# Patient Record
Sex: Male | Born: 1935 | Race: Black or African American | Hispanic: No | Marital: Married | Smoking: Never smoker
Health system: Southern US, Community
[De-identification: ages and names within clinical notes are randomized; demographics above are authoritative.]

## PROBLEM LIST (undated history)

## (undated) DIAGNOSIS — N183 Chronic kidney disease, stage 3 unspecified: Secondary | ICD-10-CM

## (undated) DIAGNOSIS — E119 Type 2 diabetes mellitus without complications: Secondary | ICD-10-CM

## (undated) DIAGNOSIS — I5189 Other ill-defined heart diseases: Secondary | ICD-10-CM

## (undated) DIAGNOSIS — I10 Essential (primary) hypertension: Secondary | ICD-10-CM

## (undated) DIAGNOSIS — M1990 Unspecified osteoarthritis, unspecified site: Secondary | ICD-10-CM

## (undated) DIAGNOSIS — R079 Chest pain, unspecified: Secondary | ICD-10-CM

## (undated) DIAGNOSIS — N429 Disorder of prostate, unspecified: Secondary | ICD-10-CM

## (undated) DIAGNOSIS — M109 Gout, unspecified: Secondary | ICD-10-CM

## (undated) HISTORY — PX: ABDOMINAL SURGERY: SHX537

## (undated) HISTORY — DX: Type 2 diabetes mellitus without complications: E11.9

## (undated) HISTORY — DX: Other ill-defined heart diseases: I51.89

## (undated) HISTORY — PX: SMALL INTESTINE SURGERY: SHX150

## (undated) HISTORY — DX: Essential (primary) hypertension: I10

## (undated) HISTORY — DX: Chest pain, unspecified: R07.9

---

## 2004-03-04 ENCOUNTER — Ambulatory Visit: Payer: Self-pay | Admitting: Internal Medicine

## 2005-08-29 ENCOUNTER — Emergency Department: Payer: Self-pay | Admitting: Emergency Medicine

## 2005-08-29 ENCOUNTER — Inpatient Hospital Stay: Payer: Self-pay | Admitting: Cardiology

## 2005-08-29 ENCOUNTER — Ambulatory Visit: Payer: Self-pay | Admitting: Internal Medicine

## 2005-08-29 ENCOUNTER — Other Ambulatory Visit: Payer: Self-pay

## 2005-08-30 ENCOUNTER — Other Ambulatory Visit: Payer: Self-pay

## 2005-08-31 ENCOUNTER — Other Ambulatory Visit: Payer: Self-pay

## 2005-10-25 ENCOUNTER — Emergency Department: Payer: Self-pay | Admitting: Internal Medicine

## 2012-10-24 ENCOUNTER — Ambulatory Visit: Payer: Self-pay | Admitting: Ophthalmology

## 2013-06-03 ENCOUNTER — Inpatient Hospital Stay: Payer: Self-pay

## 2013-06-03 LAB — CBC WITH DIFFERENTIAL/PLATELET
BASOS ABS: 0 10*3/uL (ref 0.0–0.1)
BASOS PCT: 0.4 %
Basophil #: 0 10*3/uL (ref 0.0–0.1)
Basophil %: 0.4 %
EOS ABS: 0 10*3/uL (ref 0.0–0.7)
Eosinophil #: 0 10*3/uL (ref 0.0–0.7)
Eosinophil %: 0.4 %
Eosinophil %: 0.2 %
HCT: 34.9 % — ABNORMAL LOW (ref 40.0–52.0)
HCT: 27.4 % — AB (ref 40.0–52.0)
HGB: 11.9 g/dL — ABNORMAL LOW (ref 13.0–18.0)
HGB: 9.4 g/dL — ABNORMAL LOW (ref 13.0–18.0)
LYMPHS ABS: 1.2 10*3/uL (ref 1.0–3.6)
LYMPHS ABS: 1.7 10*3/uL (ref 1.0–3.6)
LYMPHS PCT: 19.7 %
LYMPHS PCT: 24.1 %
MCH: 33.6 pg (ref 26.0–34.0)
MCH: 33.9 pg (ref 26.0–34.0)
MCHC: 34.1 g/dL (ref 32.0–36.0)
MCHC: 34.4 g/dL (ref 32.0–36.0)
MCV: 98 fL (ref 80–100)
MCV: 99 fL (ref 80–100)
MONOS PCT: 10.2 %
Monocyte #: 0.5 x10 3/mm (ref 0.2–1.0)
Monocyte #: 0.7 x10 3/mm (ref 0.2–1.0)
Monocyte %: 7.8 %
NEUTROS PCT: 71.7 %
NEUTROS PCT: 65.1 %
Neutrophil #: 4.4 10*3/uL (ref 1.4–6.5)
Neutrophil #: 4.5 10*3/uL (ref 1.4–6.5)
Platelet: 179 10*3/uL (ref 150–440)
Platelet: 173 10*3/uL (ref 150–440)
RBC: 3.54 10*6/uL — ABNORMAL LOW (ref 4.40–5.90)
RBC: 2.78 10*6/uL — ABNORMAL LOW (ref 4.40–5.90)
RDW: 16.1 % — AB (ref 11.5–14.5)
RDW: 15.8 % — ABNORMAL HIGH (ref 11.5–14.5)
WBC: 6.2 10*3/uL (ref 3.8–10.6)
WBC: 6.9 10*3/uL (ref 3.8–10.6)

## 2013-06-03 LAB — COMPREHENSIVE METABOLIC PANEL
ALBUMIN: 3 g/dL — AB (ref 3.4–5.0)
ANION GAP: 7 (ref 7–16)
Alkaline Phosphatase: 92 U/L
BILIRUBIN TOTAL: 0.3 mg/dL (ref 0.2–1.0)
BUN: 21 mg/dL — ABNORMAL HIGH (ref 7–18)
Calcium, Total: 8.5 mg/dL (ref 8.5–10.1)
Chloride: 110 mmol/L — ABNORMAL HIGH (ref 98–107)
Co2: 22 mmol/L (ref 21–32)
Creatinine: 1.5 mg/dL — ABNORMAL HIGH (ref 0.60–1.30)
GFR CALC AF AMER: 50 — AB
GFR CALC NON AF AMER: 43 — AB
Glucose: 158 mg/dL — ABNORMAL HIGH (ref 65–99)
Osmolality: 284 (ref 275–301)
POTASSIUM: 3.8 mmol/L (ref 3.5–5.1)
SGOT(AST): 35 U/L (ref 15–37)
SGPT (ALT): 34 U/L (ref 12–78)
Sodium: 139 mmol/L (ref 136–145)
Total Protein: 7 g/dL (ref 6.4–8.2)

## 2013-06-03 LAB — URINALYSIS, COMPLETE
Bacteria: NONE SEEN
Bilirubin,UR: NEGATIVE
Blood: NEGATIVE
GLUCOSE, UR: NEGATIVE mg/dL (ref 0–75)
Hyaline Cast: 4
KETONE: NEGATIVE
Leukocyte Esterase: NEGATIVE
Nitrite: NEGATIVE
Ph: 6 (ref 4.5–8.0)
Protein: 25
Specific Gravity: 1.035 (ref 1.003–1.030)
Squamous Epithelial: 1
WBC UR: 1 /HPF (ref 0–5)

## 2013-06-03 LAB — BASIC METABOLIC PANEL
Anion Gap: 6 — ABNORMAL LOW (ref 7–16)
BUN: 18 mg/dL (ref 7–18)
Calcium, Total: 7.9 mg/dL — ABNORMAL LOW (ref 8.5–10.1)
Chloride: 113 mmol/L — ABNORMAL HIGH (ref 98–107)
Co2: 24 mmol/L (ref 21–32)
Creatinine: 1.39 mg/dL — ABNORMAL HIGH (ref 0.60–1.30)
EGFR (African American): 55 — ABNORMAL LOW
EGFR (Non-African Amer.): 48 — ABNORMAL LOW
GLUCOSE: 123 mg/dL — AB (ref 65–99)
OSMOLALITY: 288 (ref 275–301)
POTASSIUM: 3.9 mmol/L (ref 3.5–5.1)
SODIUM: 143 mmol/L (ref 136–145)

## 2013-06-03 LAB — HEMOGLOBIN: HGB: 8.6 g/dL — ABNORMAL LOW (ref 13.0–18.0)

## 2013-06-03 LAB — PROTIME-INR
INR: 1
INR: 1.1
Prothrombin Time: 13.2 secs (ref 11.5–14.7)
Prothrombin Time: 14 secs (ref 11.5–14.7)

## 2013-06-03 LAB — LIPASE, BLOOD: Lipase: 149 U/L (ref 73–393)

## 2013-06-04 LAB — CBC WITH DIFFERENTIAL/PLATELET
BASOS ABS: 0 10*3/uL (ref 0.0–0.1)
BASOS PCT: 0.3 %
EOS ABS: 0 10*3/uL (ref 0.0–0.7)
EOS PCT: 0 %
HCT: 22.3 % — ABNORMAL LOW (ref 40.0–52.0)
HGB: 7.6 g/dL — ABNORMAL LOW (ref 13.0–18.0)
Lymphocyte #: 1.4 10*3/uL (ref 1.0–3.6)
Lymphocyte %: 17.7 %
MCH: 33.7 pg (ref 26.0–34.0)
MCHC: 34.2 g/dL (ref 32.0–36.0)
MCV: 99 fL (ref 80–100)
Monocyte #: 0.6 x10 3/mm (ref 0.2–1.0)
Monocyte %: 7.9 %
NEUTROS ABS: 5.9 10*3/uL (ref 1.4–6.5)
Neutrophil %: 74.1 %
Platelet: 144 10*3/uL — ABNORMAL LOW (ref 150–440)
RBC: 2.26 10*6/uL — ABNORMAL LOW (ref 4.40–5.90)
RDW: 15.9 % — ABNORMAL HIGH (ref 11.5–14.5)
WBC: 7.9 10*3/uL (ref 3.8–10.6)

## 2013-06-04 LAB — BASIC METABOLIC PANEL
Anion Gap: 5 — ABNORMAL LOW (ref 7–16)
BUN: 19 mg/dL — AB (ref 7–18)
CREATININE: 1.38 mg/dL — AB (ref 0.60–1.30)
Calcium, Total: 7.8 mg/dL — ABNORMAL LOW (ref 8.5–10.1)
Chloride: 111 mmol/L — ABNORMAL HIGH (ref 98–107)
Co2: 24 mmol/L (ref 21–32)
EGFR (African American): 56 — ABNORMAL LOW
EGFR (Non-African Amer.): 48 — ABNORMAL LOW
Glucose: 126 mg/dL — ABNORMAL HIGH (ref 65–99)
Osmolality: 283 (ref 275–301)
Potassium: 4.1 mmol/L (ref 3.5–5.1)
Sodium: 140 mmol/L (ref 136–145)

## 2013-06-04 LAB — HEMOGLOBIN: HGB: 9 g/dL — ABNORMAL LOW (ref 13.0–18.0)

## 2013-06-05 LAB — BASIC METABOLIC PANEL
Anion Gap: 5 — ABNORMAL LOW (ref 7–16)
BUN: 12 mg/dL (ref 7–18)
CREATININE: 1.41 mg/dL — AB (ref 0.60–1.30)
Calcium, Total: 8.1 mg/dL — ABNORMAL LOW (ref 8.5–10.1)
Chloride: 111 mmol/L — ABNORMAL HIGH (ref 98–107)
Co2: 25 mmol/L (ref 21–32)
EGFR (African American): 54 — ABNORMAL LOW
EGFR (Non-African Amer.): 47 — ABNORMAL LOW
GLUCOSE: 89 mg/dL (ref 65–99)
Osmolality: 280 (ref 275–301)
Potassium: 3.6 mmol/L (ref 3.5–5.1)
Sodium: 141 mmol/L (ref 136–145)

## 2013-06-05 LAB — CBC WITH DIFFERENTIAL/PLATELET
BASOS PCT: 0.6 %
Basophil #: 0.1 10*3/uL (ref 0.0–0.1)
EOS ABS: 0.1 10*3/uL (ref 0.0–0.7)
EOS PCT: 1.5 %
HCT: 25.1 % — ABNORMAL LOW (ref 40.0–52.0)
HGB: 8.7 g/dL — ABNORMAL LOW (ref 13.0–18.0)
LYMPHS ABS: 2.1 10*3/uL (ref 1.0–3.6)
LYMPHS PCT: 22.1 %
MCH: 33.1 pg (ref 26.0–34.0)
MCHC: 34.8 g/dL (ref 32.0–36.0)
MCV: 95 fL (ref 80–100)
MONO ABS: 1.1 x10 3/mm — AB (ref 0.2–1.0)
MONOS PCT: 11.5 %
Neutrophil #: 6 10*3/uL (ref 1.4–6.5)
Neutrophil %: 64.3 %
Platelet: 148 10*3/uL — ABNORMAL LOW (ref 150–440)
RBC: 2.63 10*6/uL — AB (ref 4.40–5.90)
RDW: 16.8 % — AB (ref 11.5–14.5)
WBC: 9.4 10*3/uL (ref 3.8–10.6)

## 2013-06-05 LAB — HEMOGLOBIN: HGB: 9.2 g/dL — AB (ref 13.0–18.0)

## 2013-06-06 LAB — CBC WITH DIFFERENTIAL/PLATELET
Basophil #: 0 10*3/uL (ref 0.0–0.1)
Basophil %: 0.6 %
EOS PCT: 2 %
Eosinophil #: 0.2 10*3/uL (ref 0.0–0.7)
HCT: 25.3 % — ABNORMAL LOW (ref 40.0–52.0)
HGB: 8.8 g/dL — ABNORMAL LOW (ref 13.0–18.0)
Lymphocyte #: 1.8 10*3/uL (ref 1.0–3.6)
Lymphocyte %: 21.1 %
MCH: 33.2 pg (ref 26.0–34.0)
MCHC: 34.7 g/dL (ref 32.0–36.0)
MCV: 96 fL (ref 80–100)
Monocyte #: 1.1 x10 3/mm — ABNORMAL HIGH (ref 0.2–1.0)
Monocyte %: 13.5 %
NEUTROS PCT: 62.8 %
Neutrophil #: 5.3 10*3/uL (ref 1.4–6.5)
PLATELETS: 154 10*3/uL (ref 150–440)
RBC: 2.64 10*6/uL — ABNORMAL LOW (ref 4.40–5.90)
RDW: 16.8 % — ABNORMAL HIGH (ref 11.5–14.5)
WBC: 8.5 10*3/uL (ref 3.8–10.6)

## 2013-07-15 ENCOUNTER — Ambulatory Visit: Payer: Self-pay | Admitting: Gastroenterology

## 2013-07-17 LAB — PATHOLOGY REPORT

## 2014-03-11 DIAGNOSIS — D649 Anemia, unspecified: Secondary | ICD-10-CM | POA: Insufficient documentation

## 2014-03-11 DIAGNOSIS — M1A9XX0 Chronic gout, unspecified, without tophus (tophi): Secondary | ICD-10-CM | POA: Insufficient documentation

## 2014-03-11 DIAGNOSIS — M1A00X0 Idiopathic chronic gout, unspecified site, without tophus (tophi): Secondary | ICD-10-CM | POA: Insufficient documentation

## 2014-09-10 DIAGNOSIS — M170 Bilateral primary osteoarthritis of knee: Secondary | ICD-10-CM | POA: Insufficient documentation

## 2014-09-20 NOTE — Consult Note (Signed)
Chief Complaint:  Subjective/Chief Complaint Patient seen, briefly examined, chart reviewed.  Please see full GI consult and brief consult note.  Patient presenting with 2-3 days of intermittant hematochezia.  Denies abd pain n or v.  Plans for urgent bleeding scan.  Hemodynamically stable.  Further recs to follow.   VITAL SIGNS/ANCILLARY NOTES: **Vital Signs.:   05-Jan-15 15:30  Vital Signs Type Admission  Temperature Temperature (F) 97.9  Celsius 36.6  Temperature Source oral  Pulse Pulse 60  Respirations Respirations 20  Systolic BP Systolic BP 169  Diastolic BP (mmHg) Diastolic BP (mmHg) 75  Mean BP 105  Pulse Ox % Pulse Ox % 100  Pulse Ox Activity Level  At rest  Oxygen Delivery Room Air/ 21 %   Brief Assessment:  Cardiac Regular   Respiratory clear BS   Gastrointestinal details normal No gaurding  No rigidity  protuberant, non-tender, bs positive   Lab Results: Hepatic:  05-Jan-15 10:22   Bilirubin, Total 0.3  Alkaline Phosphatase 92 (45-117 NOTE: New Reference Range 04/19/13)  SGPT (ALT) 34  SGOT (AST) 35  Total Protein, Serum 7.0  Albumin, Serum  3.0  Routine BB:  05-Jan-15 11:35   ABO Group + Rh Type B Positive  Antibody Screen NEGATIVE (Result(s) reported on 03 Jun 2013 at 12:44PM.)  Routine Chem:  05-Jan-15 10:22   Lipase 149 (Result(s) reported on 03 Jun 2013 at 12:14PM.)  Glucose, Serum  158  BUN  21  Creatinine (comp)  1.50  Sodium, Serum 139  Potassium, Serum 3.8  Chloride, Serum  110  CO2, Serum 22  Calcium (Total), Serum 8.5  Osmolality (calc) 284  eGFR (African American)  50  eGFR (Non-African American)  43 (eGFR values <61m/min/1.73 m2 may be an indication of chronic kidney disease (CKD). Calculated eGFR is useful in patients with stable renal function. The eGFR calculation will not be reliable in acutely ill patients when serum creatinine is changing rapidly. It is not useful in  patients on dialysis. The eGFR calculation may not be  applicable to patients at the low and high extremes of body sizes, pregnant women, and vegetarians.)  Anion Gap 7  Routine UA:  05-Jan-15 10:22   Color (UA) Yellow  Clarity (UA) Clear  Glucose (UA) Negative  Bilirubin (UA) Negative  Ketones (UA) Negative  Specific Gravity (UA) 1.035  Blood (UA) Negative  pH (UA) 6.0  Protein (UA) 25 mg/dL  Nitrite (UA) Negative  Leukocyte Esterase (UA) Negative (Result(s) reported on 03 Jun 2013 at 10:56AM.)  RBC (UA) <1 /HPF  WBC (UA) 1 /HPF  Bacteria (UA) NONE SEEN  Epithelial Cells (UA) 1 /HPF  Mucous (UA) PRESENT  Hyaline Cast (UA) 4 /LPF (Result(s) reported on 03 Jun 2013 at 10:56AM.)  Routine Coag:  05-Jan-15 10:22   Prothrombin 13.2  INR 1.0 (INR reference interval applies to patients on anticoagulant therapy. A single INR therapeutic range for coumarins is not optimal for all indications; however, the suggested range for most indications is 2.0 - 3.0. Exceptions to the INR Reference Range may include: Prosthetic heart valves, acute myocardial infarction, prevention of myocardial infarction, and combinations of aspirin and anticoagulant. The need for a higher or lower target INR must be assessed individually. Reference: The Pharmacology and Management of the Vitamin K  antagonists: the seventh ACCP Conference on Antithrombotic and Thrombolytic Therapy. CIHWTU.8828Sept:126 (3suppl): [id number] A HCT value >55% may artifactually increase the PT.  In one study,  the increase was an average of 25%. Reference:  "  Effect on Routine and Special Coagulation Testing Values of Citrate Anticoagulant Adjustment in Patients with High HCT Values." American Journal of Clinical Pathology 2006;126:400-405.)  Routine Hem:  05-Jan-15 10:22   WBC (CBC) 6.2  RBC (CBC)  3.54  Hemoglobin (CBC)  11.9  Hematocrit (CBC)  34.9  Platelet Count (CBC) 179  MCV 98  MCH 33.6  MCHC 34.1  RDW  16.1  Neutrophil % 71.7  Lymphocyte % 19.7  Monocyte % 7.8   Eosinophil % 0.4  Basophil % 0.4  Neutrophil # 4.4  Lymphocyte # 1.2  Monocyte # 0.5  Eosinophil # 0.0  Basophil # 0.0 (Result(s) reported on 03 Jun 2013 at 10:47AM.)   Electronic Signatures: Loistine Simas (MD)  (Signed 05-Jan-15 17:06)  Authored: Chief Complaint, VITAL SIGNS/ANCILLARY NOTES, Brief Assessment, Lab Results   Last Updated: 05-Jan-15 17:06 by Loistine Simas (MD)

## 2014-09-20 NOTE — Consult Note (Signed)
Brief Consult Note: Diagnosis: rectal bleeding.   Patient was seen by consultant.   Consult note dictated.   Comments: Appreciate consult for 81 y/o [place] American man for evaluation of rectal bleeding. No history of prior luminal evaluation. States he was doing well until Saturday night when he started having rectal bleeding. Describes passing 4 liquid marroon stools with some marroon formed pieces that evening, followed by 2 similar movements Sunday, and 2 this am- last was about 0900 this am. States that just prior to the mvt, he feels a little bloaty and has an urgent stool. Denies abdominal discomfort and abdominal pain, NV, problems with constipation, dyspepsia, and all other GI related complaints. His only other complaints are dizziness after bowel movements that started today, weakness, and some mild DOE. Of note, he was seen last October for heme positive stool in the GI clinic, but decided not to pursue luminal evaluation at that time. Hgb last Aug was 13.5. Now 11.5, pt/inr normal. Hemodynamically stable. Additionally- during exam patient passed a large amount of thin maroon blood in the commode. Abdomen protbuerant but benign. Rectal exam with red effluent of finger. No external hemorrhoids, however inside anal ring was a soft oblong area consistent with a hemorrhoid. Nontender. Impression and plan. Rectal bleeding. Differential includes diverticular bleeding v. other. Will start with GIB scan now, followed by vascular consult if positive. Also recommend following serial hemoglobins, transfusing prn.Npo for now.  Did speak with nuclear med as it is after hrs and they are arranging for scan..  Electronic Signatures: Stephens November H (NP)  (Signed 05-Jan-15 16:58)  Authored: Brief Consult Note   Last Updated: 05-Jan-15 16:58 by Theodore Demark (NP)

## 2014-09-20 NOTE — Op Note (Signed)
PATIENT NAME:  Wesley Burton, Wesley Burton MR#:  [id number] DATE OF BIRTH:  [birth year]  DATE OF PROCEDURE:  06/04/2013  PREOPERATIVE DIAGNOSES: 1. Lower gastrointestinal bleed.  2. Chronic kidney disease.  3. Hypertension.   POSTOPERATIVE DIAGNOSES:  1. Lower gastrointestinal bleed.  2. Chronic kidney disease.  3. Hypertension.   PROCEDURE: 1. Ultrasound guidance for vascular access, right femoral artery.  2. Catheter placement to IMA branches from right femoral approach.  3. Microbead polyvinyl alcohol embolization to inferior mesenteric artery and branches.  4. StarClose closure device, right femoral artery.   SURGEON: Algernon Huxley, M.D.   ANESTHESIA: Local with moderate conscious sedation.   ESTIMATED BLOOD LOSS: Minimal.   FLUOROSCOPY TIME: Approximately  4 minutes.   CONTRAST USED: 28 mL.   INDICATION FOR PROCEDURE: An 81 year old African American male who was admitted yesterday with lower gastrointestinal bleeding. He had a positive bleeding scan localized to the left colon and sigmoid colon. He is brought down for angiography for further evaluation and potential treatment. Risks and benefits were discussed. Informed consent was obtained.   DESCRIPTION OF PROCEDURE: The patient is brought to the vascular interventional radiology suite. The groin is sterilely prepped and draped and a sterile surgical field was created. The right femoral artery was visualized with ultrasound and found to be widely patent. It was then accessed under direct ultrasound guidance without difficulty with Seldinger needle. A J-wire and 5 French sheath was placed. Pigtail catheter was placed into the aorta and an RAO projection aortogram was performed. This demonstrated a patent IMA. I then used a VS1 catheter, the stiff angled Glidewire to cannulate the IMA. I then exchanged for a 5 French catheter and I was able to get past the primary IMA branches. No obvious blush was seen, but given his positive bleeding scan  findings I elected to treat the area with 500/700 micron polyvinyl alcohol beads. A total of 1 mL was deployed in the IMA sigmoidal left colon branches with flow still seen to the major branches after microbead embolization. At this point, I elected to terminate the procedure. The diagnostic catheter was removed. An oblique arteriogram was performed with the right femoral artery and StarClose closure device was deployed in the usual fashion with excellent hemostatic result. The patient tolerated the procedure well and was taken to the recovery room in stable condition.      ____________________________ Algernon Huxley, MD jsd:sg D: 06/04/2013 08:07:38 ET T: 06/04/2013 08:16:41 ET JOB#: [id number]  cc: Algernon Huxley, MD, <Dictator> Algernon Huxley MD ELECTRONICALLY SIGNED 06/17/2013 11:43

## 2014-09-20 NOTE — H&P (Signed)
PATIENT NAME:  Wesley Burton, Wesley Burton MR#:  [id number] DATE OF BIRTH:  1931-04-08  DATE OF ADMISSION:  06/03/2013  PRIMARY CARE PHYSICIAN: Cheral Marker. Ola Spurr, MD  CHIEF COMPLAINT: Rectal bleeding with diarrhea.   HISTORY OF PRESENTING ILLNESS: This 81 year old male patient with history of hypertension, CKD, arthritis, and possible hemorrhoids presents to the Emergency Room complaining of 3 days of rectal bleeding with diarrhea. The patient's episodes started with 3 rounds of diarrhea, which were liquid on Saturday along with dark red and bright red blood. The patient did feel a bit lightheaded, dizzy. He thought this would resolve, but this continued until today and the patient has presented to the Emergency Room. Here, the patient's hemoglobin is 11.5, which is same as prior blood work available from 2007. The patient is being admitted to the hospitalist service for further work-up of his rectal bleeding. The patient mentions that he had some blood in the stool a few months back on cleaning and was thought to have likely hemorrhoids, although he never had a colonoscopy or any rectal exam per patient. He was given stool cards, although he is not aware of those results. He was referred to a doctor by his primary care physician, although records are not available and he does not remember the doctor's name, possibly GI consultant.   Today, he does not have any nausea, vomiting, abdominal pain. He is not on any aspirin, Plavix, Coumadin, or other anticoagulants. Never had similar amount of blood, which he mentions is in large quantities now. In the Emergency Room, rectal exam was done by Dr. Corky Downs of Emergency Room, who did not find any hemorrhoids but stool positive for blood.   PAST MEDICAL HISTORY: 1.  Hypertension.  2.  Chronic kidney disease, stage III.  3.  Arthritis.   HOME MEDICATIONS: Include: 1.  Lisinopril 40 mg daily.  2.  Hydrochlorothiazide/triamterene 1 tablet daily.  3.  Atenolol 25 mg  daily.  4.  Cardura 4 mg daily.   SOCIAL HISTORY: The patient is married, lives with wife. Has never smoked. No alcohol. No illicit drugs. He is retired.   CODE STATUS: Full code.  FAMILY HISTORY: Father died at age 61, had hypertension.   REVIEW OF SYSTEMS:    CONSTITUTIONAL: No fever. Has some fatigue.  EYES: No blurred vision, pain, redness.  EARS, NOSE, THROAT: No tinnitus, ear pain, hearing loss.  RESPIRATORY: No cough, wheeze, hemoptysis.  CARDIOVASCULAR: No chest pain, orthopnea, edema.  GASTROINTESTINAL: No nausea, vomiting. Has diarrhea and rectal bleeding.  GENITOURINARY: No dysuria, hematuria, frequency.  ENDOCRINE: No polyuria, nocturia, thyroid problems. HEMATOLOGIC AND LYMPHATIC: No anemia, easy bruising. Does have bleeding.  INTEGUMENTARY: No acne, rash, lesion.  MUSCULOSKELETAL: No back pain, arthritis.  NEUROLOGIC: No focal numbness, weakness, dysarthria. PSYCHIATRIC: No anxiety, depression.   ALLERGIES: No known drug allergies.   PHYSICAL EXAMINATION: VITAL SIGNS: Temperature 98.2, pulse 71, blood pressure 159/70, saturating 99% on room air.  GENERAL: Obese African American male patient lying in bed, seems comfortable, conversational, cooperative with exam.  PSYCHIATRIC: Alert, oriented x 3. Mood and affect appropriate. Judgment intact.  HEENT: Atraumatic, normocephalic. Oral mucosa moist and pink. External ears and nose normal. No pallor or icterus. Pupils bilaterally equal and reactive to light.  NECK: Supple. No thyromegaly. No palpable lymph nodes. Trachea midline. No carotid bruits or JVD.  CARDIOVASCULAR: S1, S2, without any murmurs. Peripheral pulses 2+.  RESPIRATORY: Normal work of breathing. Clear to auscultation on both sides.  GASTROINTESTINAL: Soft abdomen, nontender. Bowel  sounds present. No hepatosplenomegaly palpable.  RECTAL: Deferred.  GENITOURINARY: No CVA tenderness or bladder distention.  SKIN: Warm and dry. No petechiae, rash, ulcers.   MUSCULOSKELETAL: No joint swelling, redness, effusion of the large joints. Normal muscle tone.  NEUROLOGICAL: Motor strength 5/5 in upper and lower extremities. Sensation remains intact all over.  LYMPHATIC: No cervical lymphadenopathy.   LABORATORY AND DIAGNOSTIC DATA: Show glucose of 158, BUN 21, creatinine 1.50, sodium 139, potassium 3.8, GFR of 43. AST, ALT, alkaline phosphatase, bilirubin normal. WBC 6.3, hemoglobin 11.9, platelets 179.   Blood group is B negative.   INR 1.   Urinalysis shows no bacteria.   EKG shows normal sinus rhythm with T wave inversions in the lateral leads.   ASSESSMENT AND PLAN: 1.  Rectal bleed with diarrhea in a patient who is not on any blood thinners. He is afebrile. White count is normal. The patient did have 3 episodes of liquid stools initially, but this seems to have resolved at this time. I suspect the patient likely has diverticular bleed. He mentions he never had a colonoscopy, although he seems to be a poor historian. Will monitor the hemoglobin at this time. Will need a colonoscopy. Need to make a decision as to if he needs this inpatient or outpatient. Will admit for observation. Consult GI. No heparin or Lovenox. Will check a hemoglobin in 6 hours and repeat tomorrow morning. May need blood transfusion if there is acute drop. Will also start him on IV fluids. The patient will be on clear liquid diet.  2.  Hypertension, uncontrolled. Will use IV p.r.n. medications and continue home medications at this time.  3.  Anemia, mild. Seems stable and is normocytic.  4.  Chronic kidney disease III, stable.  5.  Deep venous thrombosis prophylaxis with sequential compression devices.  6.  Code status: Full code.   TIME SPENT: Today on this case was 40 minutes.    ____________________________ Leia Alf Sher Hellinger, MD srs:jcm D: 06/03/2013 14:43:32 ET T: 06/03/2013 15:50:05 ET JOB#: [id number]  cc: Alveta Heimlich R. Orvie Caradine, MD, <Dictator> Cheral Marker. Ola Spurr,  MD Carver MD ELECTRONICALLY SIGNED 06/06/2013 11:14

## 2014-09-20 NOTE — Consult Note (Signed)
CHIEF COMPLAINT and HISTORY:  Subjective/Chief Complaint rectal bleeding   History of Present Illness Patient admitted with painless rectal bleeding and diarrhea for <24 hours.  Has had multiple episodes of bloody stools and diarrhea with blood.  Was found to have left colon/sigmoid area positive for bleeding on nuc med scan, which I have reviewed.   PAST MEDICAL/SURGICAL HISTORY:  Past Medical History:   arthritis:    HTN:   ALLERGIES:  Allergies:  No Known Allergies:   HOME MEDICATIONS:  Home Medications: Medication Instructions Status  atenolol 25 mg oral tablet 1 tab(s) orally once a day Active  doxazosin 4 mg oral tablet 1 tab(s) orally once a day Active  Imdur 30 mg oral tablet, extended release 1 tab(s) orally once a day Active  lisinopril 40 mg oral tablet 1 tab(s) orally once a day Active  hydrochlorothiazide-triamterene 25 mg-37.5 mg oral tablet 1 tab(s) orally once a day Active   Family and Social History:  Family History Non-Contributory   Social History negative tobacco, negative ETOH, negative Illicit drugs   Place of Living Home   Review of Systems:  Subjective/Chief Complaint BRBPR   Fever/Chills No   Cough No   Sputum No   Abdominal Pain No   Diarrhea Yes   Constipation No   Nausea/Vomiting No   SOB/DOE No   Chest Pain No   Telemetry Reviewed NSR   Dysuria No   Tolerating PT Yes   Tolerating Diet Yes   Medications/Allergies Reviewed Medications/Allergies reviewed   Physical Exam:  GEN well developed, well nourished, younger than stated age   HEENT pink conjunctivae, moist oral mucosa   NECK No masses  trachea midline   RESP normal resp effort  no use of accessory muscles   CARD regular rate  no JVD   VASCULAR ACCESS none   ABD denies tenderness  normal BS   GU no superpubic tenderness   LYMPH negative neck, negative axillae   EXTR negative cyanosis/clubbing, negative edema   SKIN normal to palpation, skin turgor  good   NEURO cranial nerves intact, motor/sensory function intact   PSYCH alert, A+O to time, place, person   LABS:  Laboratory Results: Hepatic:    05-Jan-15 10:22, Comprehensive Metabolic Panel  Bilirubin, Total 0.3  Alkaline Phosphatase 92  45-117  NOTE: New Reference Range  04/19/13  SGPT (ALT) 34  SGOT (AST) 35  Total Protein, Serum 7.0  Albumin, Serum 3.0  Routine BB:    05-Jan-15 11:30, Crossmatch 2 Units  Crossmatch Unit 1 Ready  Crossmatch Unit 2 Ready  Result(s) reported on 04 Jun 2013 at 02:05AM.    05-Jan-15 11:35, Type and Antibody Screen  ABO Group + Rh Type   B Positive  Antibody Screen NEGATIVE  Result(s) reported on 03 Jun 2013 at 12:44PM.  Routine Chem:    05-Jan-15 10:22, Comprehensive Metabolic Panel  Glucose, Serum 158  BUN 21  Creatinine (comp) 1.50  Sodium, Serum 139  Potassium, Serum 3.8  Chloride, Serum 110  CO2, Serum 22  Calcium (Total), Serum 8.5  Osmolality (calc) 284  eGFR (African American) 50  eGFR (Non-African American) 43  eGFR values <97m/min/1.73 m2 may be an indication of chronic  kidney disease (CKD).  Calculated eGFR is useful in patients with stable renal function.  The eGFR calculation will not be reliable in acutely ill patients  when serum creatinine is changing rapidly. It is not useful in   patients on dialysis. The eGFR calculation may not be applicable  to patients at the low and high extremes of body sizes, pregnant  women, and vegetarians.  Anion Gap 7    05-Jan-15 10:22, Lipase  Lipase 149  Result(s) reported on 03 Jun 2013 at 12:14PM.    05-Jan-15 73:42, Basic Metabolic Panel (w/Total Calcium)  Glucose, Serum 123  BUN 18  Creatinine (comp) 1.39  Sodium, Serum 143  Potassium, Serum 3.9  Chloride, Serum 113  CO2, Serum 24  Calcium (Total), Serum 7.9  Anion Gap 6  Osmolality (calc) 288  eGFR (African American) 55  eGFR (Non-African American) 48  eGFR values <88m/min/1.73 m2 may be an indication of  chronic  kidney disease (CKD).  Calculated eGFR is useful in patients with stable renal function.  The eGFR calculation will not be reliable in acutely ill patients  when serum creatinine is changing rapidly. It is not useful in   patients on dialysis. The eGFR calculation may not be applicable  to patients at the low and high extremes of body sizes, pregnant  women, and vegetarians.    06-Jan-15 087:68 Basic Metabolic Panel (w/Total Calcium)  Glucose, Serum 126  BUN 19  Creatinine (comp) 1.38  Sodium, Serum 140  Potassium, Serum 4.1  Chloride, Serum 111  CO2, Serum 24  Calcium (Total), Serum 7.8  Anion Gap 5  Osmolality (calc) 283  eGFR (African American) 56  eGFR (Non-African American) 48  eGFR values <640mmin/1.73 m2 may be an indication of chronic  kidney disease (CKD).  Calculated eGFR is useful in patients with stable renal function.  The eGFR calculation will not be reliable in acutely ill patients  when serum creatinine is changing rapidly. It is not useful in   patients on dialysis. The eGFR calculation may not be applicable  to patients at the low and high extremes of body sizes, pregnant  women, and vegetarians.  Routine UA:    05-Jan-15 10:22, Urinalysis  Color (UA) Yellow  Clarity (UA) Clear  Glucose (UA) Negative  Bilirubin (UA) Negative  Ketones (UA) Negative  Specific Gravity (UA) 1.035  Blood (UA) Negative  pH (UA) 6.0  Protein (UA) 25 mg/dL  Nitrite (UA) Negative  Leukocyte Esterase (UA) Negative  Result(s) reported on 03 Jun 2013 at 10:56AM.  RBC (UA) <1 /HPF  WBC (UA) 1 /HPF  Bacteria (UA)   NONE SEEN  Epithelial Cells (UA) 1 /HPF  Mucous (UA) PRESENT  Hyaline Cast (UA) 4 /LPF  Result(s) reported on 03 Jun 2013 at 10:56AM.  Routine Coag:    05-Jan-15 10:22, Prothrombin Time  Prothrombin 13.2  INR 1.0  INR reference interval applies to patients on anticoagulant therapy.  A single INR therapeutic range for coumarins is not optimal for  all  indications; however, the suggested range for most indications is  2.0 - 3.0.  Exceptions to the INR Reference Range may include: Prosthetic heart  valves, acute myocardial infarction, prevention of myocardial  infarction, and combinations of aspirin and anticoagulant. The need  for a higher or lower target INR must be assessed individually.  Reference: The Pharmacology and Management of the Vitamin K   antagonists: the seventh ACCP Conference on Antithrombotic and  Thrombolytic Therapy. ChTLXBW.6203ept:126 (3suppl): 20N9146842 A HCT value >55% may artifactually increase the PT.  In one study,   the increase was an average of 25%.  Reference:  "Effect on Routine and Special Coagulation Testing Values  of Citrate Anticoagulant Adjustment in Patients with High HCT Values."  American Journal of Clinical Pathology 2006;126:400-405.  05-Jan-15 18:58, Prothrombin Time  Prothrombin 14.0  INR 1.1  INR reference interval applies to patients on anticoagulant therapy.  A single INR therapeutic range for coumarins is not optimal for all  indications; however, the suggested range for most indications is  2.0 - 3.0.  Exceptions to the INR Reference Range may include: Prosthetic heart  valves, acute myocardial infarction, prevention of myocardial  infarction, and combinations of aspirin and anticoagulant. The need  for a higher or lower target INR must be assessed individually.  Reference: The Pharmacology and Management of the Vitamin K   antagonists: the seventh ACCP Conference on Antithrombotic and  Thrombolytic Therapy. UQJFH.5456 Sept:126 (3suppl): [id number].  A HCT value >55% may artifactually increase the PT.  In one study,   the increase was an average of 25%.  Reference:  "Effect on Routine and Special Coagulation Testing Values  of Citrate Anticoagulant Adjustment in Patients with High HCT Values."  American Journal of Clinical Pathology 2006;126:400-405.  Routine Hem:     05-Jan-15 10:22, CBC Profile  WBC (CBC) 6.2  RBC (CBC) 3.54  Hemoglobin (CBC) 11.9  Hematocrit (CBC) 34.9  Platelet Count (CBC) 179  MCV 98  MCH 33.6  MCHC 34.1  RDW 16.1  Neutrophil % 71.7  Lymphocyte % 19.7  Monocyte % 7.8  Eosinophil % 0.4  Basophil % 0.4  Neutrophil # 4.4  Lymphocyte # 1.2  Monocyte # 0.5  Eosinophil # 0.0  Basophil # 0.0  Result(s) reported on 03 Jun 2013 at 10:47AM.    05-Jan-15 18:58, CBC Profile  WBC (CBC) 6.9  RBC (CBC) 2.78  Hemoglobin (CBC) 9.4  Hematocrit (CBC) 27.4  Platelet Count (CBC) 173  MCV 99  MCH 33.9  MCHC 34.4  RDW 15.8  Neutrophil % 65.1  Lymphocyte % 24.1  Monocyte % 10.2  Eosinophil % 0.2  Basophil % 0.4  Neutrophil # 4.5  Lymphocyte # 1.7  Monocyte # 0.7  Eosinophil # 0.0  Basophil # 0.0  Result(s) reported on 03 Jun 2013 at 07:42PM.    05-Jan-15 23:55, Hemoglobin  Hemoglobin (CBC) 8.6  Result(s) reported on 03 Jun 2013 at 11:48PM.    06-Jan-15 04:00, CBC Profile  WBC (CBC) 7.9  RBC (CBC) 2.26  Hemoglobin (CBC) 7.6  Hematocrit (CBC) 22.3  Platelet Count (CBC) 144  MCV 99  MCH 33.7  MCHC 34.2  RDW 15.9  Neutrophil % 74.1  Lymphocyte % 17.7  Monocyte % 7.9  Eosinophil % 0.0  Basophil % 0.3  Neutrophil # 5.9  Lymphocyte # 1.4  Monocyte # 0.6  Eosinophil # 0.0  Basophil # 0.0  Result(s) reported on 04 Jun 2013 at 05:17AM.   RADIOLOGY:  Radiology Results: LabUnknown:    05-Jan-15 18:32, GI Blood Loss Study - Nuc Med  PACS Image  Nuclear Med:  GI Blood Loss Study - Nuc Med  REASON FOR EXAM:    bleeding  COMMENTS:       PROCEDURE: NM  - NM GI BLOOD LOSS STUDY  - Jun 03 2013  6:32PM     CLINICAL DATA:  Dark red blood per rectum.  Bleeding for 3 days.    EXAM:  NUCLEAR MEDICINE GASTROINTESTINAL BLEEDING SCAN    TECHNIQUE:  Sequential abdominal images were obtained following intravenous  administration of Tc-30mlabeled red blood cells.    COMPARISON:  None.  RADIOPHARMACEUTICALS:  23.266m Tc-9941mn-vitro labeled red cells.    FINDINGS:  At approximate 60 min into the scan, a long  of tubular segment of  accumulation tagged red blood cells are noted in the left abdomen.  This continues to increase intensity mildly of the course exam.  Counts clearly outlined the sigmoid colon and descending colon body  and exam. . Burtis Junes a bleeding to initiate in the proximal left colon  (splenic flexure) but cannot exclude a sigmoid source of bleeding.     IMPRESSION:  Active gastrointestinal bleeding accumulating within the descending  colon and sigmoid colon. Favor proximal descending colon as source  but cannot exclude sigmoid colon as source. .  Findings conveyed toMARTIN SKULSKIE on 06/03/2013  at18:41.      Electronically Signed    By: Suzy Bouchard M.D.    On: 06/03/2013 18:42         Verified By: Rennis Golden, M.D.,   ASSESSMENT AND PLAN:  Assessment/Admission Diagnosis lower GI bleed CKD HTN   Plan Has had multiple episodes of bloody stools and diarrhea with blood.  Was found to have left colon/sigmoid area positive for bleeding on nuc med scan, which I have reviewed. Given these findings, it is reasonable to proceed with angiogram and planned embolization of left colon/sigmoid area.  Other options would include observation or surgical therapy.  Patient agreeable to proceed with embolization this am.   level 4   Electronic Signatures: Algernon Huxley (MD)  (Signed 06-Jan-15 07:18)  Authored: Chief Complaint and History, PAST MEDICAL/SURGICAL HISTORY, ALLERGIES, HOME MEDICATIONS, Family and Social History, Review of Systems, Physical Exam, LABS, RADIOLOGY, Assessment and Plan   Last Updated: 06-Jan-15 07:18 by Algernon Huxley (MD)

## 2014-09-20 NOTE — Discharge Summary (Signed)
PATIENT NAME:  Wesley Burton, STEERS MR#:  [id number] DATE OF BIRTH:  Apr 15, 1931  DATE OF ADMISSION:  06/03/2013 DATE OF DISCHARGE:  06/06/2013  DISCHARGE DIAGNOSES:  1.  Acute lower gastrointestinal bleed, status post embolization by Dr. Lucky Cowboy.  2.  Acute on chronic blood loss anemia.  3.  Hypertension.  4.  Dizziness.  5.  Chronic kidney disease.   HISTORY OF PRESENT ILLNESS: Please see initial history and physical. Briefly, the patient  was admitted with bright red blood per rectum.   HOSPITAL COURSE BY ISSUE:  1.  Bright red blood per rectum. The patient was seen by GI as well as by Dr. Lucky Cowboy, who wants a  bleeding scan. This showed localized bleeding. The patient underwent embolectomy intravascularly by Dr. Lucky Cowboy on January 06. He tolerated the procedure quite well. He had no further bleeding since then. His hemoglobin remained stable and on the day of discharge his hemoglobin is 8.8, hematocrit 25.3. He did receive 1 unit of blood.  2.  Hypertension. Blood pressure remained has well controlled on his outpatient medications.  3.  Dizziness. He had some mild dizziness initially when he first started to get out of bed. He was seen by PT who cleared him for home without home health.  4.  FEN. He tolerated advancement from clears to regular diet.   DISCHARGE FOLLOWUP: The patient will follow up in 1 to 2 weeks with Dr. Ola Spurr, as well as with Dr. Rayann Heman. He will need a colonoscopy at some point.  DISCHARGE DIET: Low sodium, regular consistency.   DISCHARGE MEDICATIONS: The patient will be discharged on atenolol 25 mg once a day, doxazosin 4 mg once a day, Imdur 30 extended-release once a day, lisinopril 40 once a day and hydrochlorothiazide/triamterene 25/37.5 once a day.   The patient is advised to return if he gets any dizziness, lightheadedness, presyncopal feelings or has any at all recurrent GI bleeding or develops any abdominal pain.   TIME SPENT: This discharge took 40 minutes.   ____________________________ Cheral Marker. Ola Spurr, MD dpf:aw D: 06/06/2013 08:29:54 ET T: 06/06/2013 08:46:54 ET JOB#: [id number]  cc: Cheral Marker. Ola Spurr, MD, <Dictator> Maricel Swartzendruber Ola Spurr MD ELECTRONICALLY SIGNED 06/07/2013 22:15

## 2014-09-20 NOTE — Consult Note (Signed)
Chief Complaint:  Subjective/Chief Complaint no further hematochezia since last night.  s/p microembolization this am.  no nausea or abdominal pain.   VITAL SIGNS/ANCILLARY NOTES: **Vital Signs.:   06-Jan-15 12:55  Vital Signs Type 1 hr Post Blood  Temperature Temperature (F) 98.1  Celsius 36.7  Temperature Source oral  Pulse Pulse 60  Respirations Respirations 16  Systolic BP Systolic BP 161  Diastolic BP (mmHg) Diastolic BP (mmHg) 60  Mean BP 92  Pulse Ox % Pulse Ox % 96  Oxygen Delivery Room Air/ 21 %    14:00  Vital Signs Type Routine  Pulse Pulse 66  Respirations Respirations 16  Systolic BP Systolic BP 096  Diastolic BP (mmHg) Diastolic BP (mmHg) 85  Mean BP 108  Pulse Ox % Pulse Ox % 95  Oxygen Delivery Room Air/ 21 %  Pulse Ox Heart Rate 66   Brief Assessment:  Cardiac Regular   Respiratory clear BS   Gastrointestinal details normal Soft  Nontender  Nondistended  No masses palpable  Bowel sounds normal   Lab Results: Routine Chem:  06-Jan-15 04:00   Glucose, Serum  126  BUN  19  Creatinine (comp)  1.38  Sodium, Serum 140  Potassium, Serum 4.1  Chloride, Serum  111  CO2, Serum 24  Calcium (Total), Serum  7.8  Anion Gap  5  Osmolality (calc) 283  eGFR (African American)  56  eGFR (Non-African American)  48 (eGFR values <75m/min/1.73 m2 may be an indication of chronic kidney disease (CKD). Calculated eGFR is useful in patients with stable renal function. The eGFR calculation will not be reliable in acutely ill patients when serum creatinine is changing rapidly. It is not useful in  patients on dialysis. The eGFR calculation may not be applicable to patients at the low and high extremes of body sizes, pregnant women, and vegetarians.)  Routine Hem:  05-Jan-15 10:22   Hemoglobin (CBC)  11.9    18:58   Hemoglobin (CBC)  9.4    23:55   Hemoglobin (CBC)  8.6 (Result(s) reported on 03 Jun 2013 at 11:48PM.)  06-Jan-15 04:00   WBC (CBC) 7.9  RBC (CBC)   2.26  Hemoglobin (CBC)  7.6  Hematocrit (CBC)  22.3  Platelet Count (CBC)  144  MCV 99  MCH 33.7  MCHC 34.2  RDW  15.9  Neutrophil % 74.1  Lymphocyte % 17.7  Monocyte % 7.9  Eosinophil % 0.0  Basophil % 0.3  Neutrophil # 5.9  Lymphocyte # 1.4  Monocyte # 0.6  Eosinophil # 0.0  Basophil # 0.0 (Result(s) reported on 04 Jun 2013 at 05:17AM.)   Radiology Results: Nuclear Med:    05-Jan-15 18:32, GI Blood Loss Study - Nuc Med  GI Blood Loss Study - Nuc Med   REASON FOR EXAM:    bleeding  COMMENTS:       PROCEDURE: NM  - NM GI BLOOD LOSS STUDY  - Jun 03 2013  6:32PM     CLINICAL DATA:  Dark red blood per rectum.  Bleeding for 3 days.    EXAM:  NUCLEAR MEDICINE GASTROINTESTINAL BLEEDING SCAN    TECHNIQUE:  Sequential abdominal images were obtained following intravenous  administration of Tc-977mabeled red blood cells.    COMPARISON:  None.  RADIOPHARMACEUTICALS:  23.83m50mTc-58m48mvitro labeled red cells.    FINDINGS:  At approximate 60 min into the scan, a long of tubular segment of  accumulation tagged red blood cells are noted in the  left abdomen.  This continues to increase intensity mildly of the course exam.  Counts clearly outlined the sigmoid colon and descending colon body  and exam. . Burtis Junes a bleeding to initiate in the proximal left colon  (splenic flexure) but cannot exclude a sigmoid source of bleeding.     IMPRESSION:  Active gastrointestinal bleeding accumulating within the descending  colon and sigmoid colon. Favor proximal descending colon as source  but cannot exclude sigmoid colon as source. .  Findings conveyed toMARTIN Blaise Grieshaber on 06/03/2013  at18:41.      Electronically Signed    By: Suzy Bouchard M.D.    On: 06/03/2013 18:42         Verified By: Rennis Golden, M.D.,   Assessment/Plan:  Assessment/Plan:  Assessment 1) hematochezia-stable, s/p microembolization per vascular surgery.   Plan 1) patietn was given one unit prbc this  am, continue observation, will advance diet tomorrow to full liquids if no problems.  Patietn will need to have a colonoscopy as outpatient in about 6 weeks.  following.  Dr Rayann Heman to see patient tomorrow, thurs.   Electronic Signatures: Loistine Simas (MD)  (Signed 06-Jan-15 15:39)  Authored: Chief Complaint, VITAL SIGNS/ANCILLARY NOTES, Brief Assessment, Lab Results, Radiology Results, Assessment/Plan   Last Updated: 06-Jan-15 15:39 by Loistine Simas (MD)

## 2014-09-20 NOTE — Consult Note (Signed)
PATIENT NAME:  Wesley Burton, Wesley Burton MR#:  [id number] DATE OF BIRTH:  04-Nov-1930  DATE OF CONSULTATION:  06/03/2013  REFERRING PHYSICIAN:  Dr. Darvin Neighbours.  CONSULTING PROVIDER:  Theodore Demark, NP  REASON FOR CONSULTATION: Evaluation of rectal bleeding.   HISTORY OF PRESENT ILLNESS: I appreciate consult for this 81 year old [place] American man admitted today for evaluation of rectal bleeding. He has no history of prior luminal evaluation. He states he was doing well until Saturday night when he started having rectal bleeding. Describes passing 4 liquid maroon stools with some maroon formed pieces that evening followed by 2 similar movements on Sunday and 2 this morning. The last was about 0900. States that just prior to the movement, he feels a little bloated and has an urgent stool. Otherwise he denies abdominal discomfort and abdominal pain, nausea, vomiting, problems with constipation, dyspepsia and all other GI-related complaints.   His only other complaints are dizziness after bowel movements that started today, weakness, and some mild DOE. Unsure what triggered this episode.   Of note, he was seen last October for heme-positive stool in the GI clinic but decided not to pursue luminal evaluation at that time. Hemoglobin last August was 13.5, now 11.5. PT/INR normal. He is hemodynamically stable.   PAST MEDICAL HISTORY: Hypertension; chronic kidney disease, stage III; cardiovascular disease; BPH; glucose intolerance; gout; cataracts with cataract surgery; elevated PSA that he follows with Dr. Bernardo Heater for; arthritis; hemorrhoids.   ALLERGIES: SEASONAL ALLERGIES. NKDA.   MEDICATIONS: Finasteride 5 mg p.o. daily, atenolol 100 mg p.o. daily, loratadine 10 mg p.o. daily, doxazosin 4 mg p.o. daily, lisinopril 40 mg p.o. daily, allopurinol 300 mg p.o. q.p.m.,  Flonase spray 2 sprays each nostril once a day, nifedipine 90 mg extended release 1 daily, B12 over-the-counter supplement 1 daily.   SOCIAL  HISTORY: Lives at home with family. No EtOH, illicits, or tobacco.   FAMILY HISTORY: No known history of GI malignancy, colon polyps, inflammatory bowel disease, liver disease, or peptic ulcer disease.   REVIEW OF SYSTEMS: A 10-point review done. Negative other than what is noted above.   MOST RECENT LABORATORY DATA: Glucose 158, BUN 21, creatinine 1.5, sodium 139, potassium 3.8, GFR 50, lipase 149, calcium 8.5, total protein 7, albumin 3, bilirubin 0.3, ALP 92, AST 35, ALT 34. WBC 6.2, hemoglobin 11.9, hematocrit 34.5, platelet count 179, normocytic with increased RDW. PT 13.2. INR 1.0.   PHYSICAL EXAMINATION:  VITAL SIGNS: Temp 97.9, pulse 60, respiratory rate 20, blood pressure 106/65, SaO2 of 100% on room air.  GENERAL: Pleasant, well-appearing elderly man in no acute distress.  HEENT: Normocephalic, atraumatic. Sclerae clear, conjunctivae pink. Mucous membranes pink and moist.  NECK: Supple. No JVD or lymphadenopathy.  CHEST: Respirations eupneic.  LUNGS: CTAB.  CARDIAC: S1, S2. RRR. No MRG and 1+ ankle edema bilaterally.  ABDOMEN: Protuberant abdomen. Bowel sounds x4, nondistended, nontender. No guarding, rigidity, peritoneal signs, masses or other abnormalities.  RECTAL: No external hemorrhoids. Do note a small oblong area around midline, soft, nontender, feels consistent with internal hemorrhoid. Stool reddish.  SKIN: Warm, dry, pink. No erythema, lesion or rash.  NEUROLOGIC: Alert, oriented x3. Cranial nerves II through XII intact. Speech clear. No facial droop.  EXTREMITIES: MAEW x4. Gait steady. Sensation intact. Strength five out of five.   IMPRESSION AND PLAN: Rectal bleeding. During exam, did get up to have a bowel movement which was a large amount of thin maroon blood in the commode. Differential includes diverticular bleeding versus other. We will  start with gastrointestinal bleed scan now followed by vascular consult if positive.   I did speak with nuclear medicine as it is  after hours and they are currently arranging for the scan. Also recommend following serial hemoglobins, transfusing p.r.n. and n.p.o. for now. Further recommendations are to follow.   These services were provided by Stephens November, MSN, [facility], in collaboration with Loistine Simas, M.D. with whom I have discussed this patient in full.   Thank you very much for this consult.    ____________________________ Theodore Demark, NP chl:np D: 06/03/2013 17:04:00 ET T: 06/03/2013 17:56:24 ET JOB#: [id number]  cc: Theodore Demark, NP, <Dictator> [place] SIGNED 06/19/2013 17:06

## 2014-09-20 NOTE — Consult Note (Signed)
Chief Complaint:  Subjective/Chief Complaint Patietn continuing with intermittant hematochezia.  Bleeding scan positive in region of sigmoid through splenic flexure.  Repeat labs ordered stat.  Case discussed with Dr Lucky Cowboy.  Will discuss possible transfer to unit  depending on results.   Electronic Signatures: Loistine Simas (MD)  (Signed 05-Jan-15 18:46)  Authored: Chief Complaint   Last Updated: 05-Jan-15 18:46 by Loistine Simas (MD)

## 2014-12-30 IMAGING — NM NUCLEAR MEDICINE GASTROINTESTINAL BLEEDING STUDY
1 series · 6 of 6 positions shown · non-contrast
Comparison: None.

RADIOPHARMACEUTICALS:  8L.8m0i 5c-QQm in-vitro labeled red cells.

CLINICAL DATA: Dark red blood per rectum.  Bleeding for 3 days.

EXAM:
NUCLEAR MEDICINE GASTROINTESTINAL BLEEDING SCAN
TECHNIQUE: Sequential abdominal images were obtained following intravenous
administration of 5c-QQm labeled red blood cells.

[Series 1000: gi bleed · 4.80mm/px · 6 of 250 frames shown]
[frame 21/250]
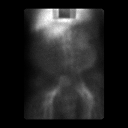
[frame 63/250]
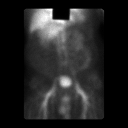
[frame 105/250]
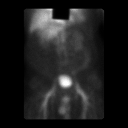
[frame 146/250]
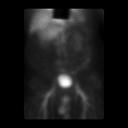
[frame 188/250]
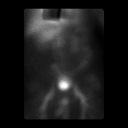
[frame 230/250]
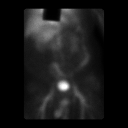

[6 of 6 positions shown; findings below may reference images not displayed]

FINDINGS: At approximate 60 min into the scan, a long of tubular segment of
accumulation tagged red blood cells are noted in the left abdomen.
This continues to increase intensity mildly of the course exam.
Counts clearly outlined the sigmoid colon and descending colon body
and exam. . Favor a bleeding to initiate in the proximal left colon
(splenic flexure) but cannot exclude a sigmoid source of bleeding.
IMPRESSION: Active gastrointestinal bleeding accumulating within the descending
colon and sigmoid colon. Favor proximal descending colon as source
but cannot exclude sigmoid colon as source. .

Findings conveyed Saira Ogden on 06/03/2013  at[DATE].

## 2015-12-24 ENCOUNTER — Inpatient Hospital Stay
Admission: EM | Admit: 2015-12-24 | Discharge: 2015-12-26 | DRG: 684 | Disposition: A | Discharge status: Home / Self Care | Payer: Medicare Other | Attending: Internal Medicine | Admitting: Internal Medicine

## 2015-12-24 ENCOUNTER — Emergency Department: Payer: Medicare Other

## 2015-12-24 ENCOUNTER — Encounter: Payer: Self-pay | Admitting: Emergency Medicine

## 2015-12-24 DIAGNOSIS — F17200 Nicotine dependence, unspecified, uncomplicated: Secondary | ICD-10-CM | POA: Diagnosis present

## 2015-12-24 DIAGNOSIS — E1122 Type 2 diabetes mellitus with diabetic chronic kidney disease: Secondary | ICD-10-CM | POA: Diagnosis present

## 2015-12-24 DIAGNOSIS — N400 Enlarged prostate without lower urinary tract symptoms: Secondary | ICD-10-CM | POA: Diagnosis present

## 2015-12-24 DIAGNOSIS — N183 Chronic kidney disease, stage 3 unspecified: Secondary | ICD-10-CM | POA: Diagnosis present

## 2015-12-24 DIAGNOSIS — R0789 Other chest pain: Secondary | ICD-10-CM | POA: Diagnosis not present

## 2015-12-24 DIAGNOSIS — R079 Chest pain, unspecified: Secondary | ICD-10-CM | POA: Diagnosis not present

## 2015-12-24 DIAGNOSIS — I129 Hypertensive chronic kidney disease with stage 1 through stage 4 chronic kidney disease, or unspecified chronic kidney disease: Principal | ICD-10-CM | POA: Diagnosis present

## 2015-12-24 DIAGNOSIS — Z8249 Family history of ischemic heart disease and other diseases of the circulatory system: Secondary | ICD-10-CM

## 2015-12-24 DIAGNOSIS — M109 Gout, unspecified: Secondary | ICD-10-CM | POA: Diagnosis present

## 2015-12-24 DIAGNOSIS — F0390 Unspecified dementia without behavioral disturbance: Secondary | ICD-10-CM | POA: Diagnosis present

## 2015-12-24 DIAGNOSIS — I10 Essential (primary) hypertension: Secondary | ICD-10-CM | POA: Diagnosis present

## 2015-12-24 DIAGNOSIS — Z9889 Other specified postprocedural states: Secondary | ICD-10-CM | POA: Diagnosis not present

## 2015-12-24 DIAGNOSIS — Z833 Family history of diabetes mellitus: Secondary | ICD-10-CM

## 2015-12-24 DIAGNOSIS — I209 Angina pectoris, unspecified: Secondary | ICD-10-CM | POA: Diagnosis present

## 2015-12-24 DIAGNOSIS — E119 Type 2 diabetes mellitus without complications: Secondary | ICD-10-CM

## 2015-12-24 HISTORY — DX: Disorder of prostate, unspecified: N42.9

## 2015-12-24 HISTORY — DX: Gout, unspecified: M10.9

## 2015-12-24 HISTORY — DX: Unspecified osteoarthritis, unspecified site: M19.90

## 2015-12-24 HISTORY — DX: Chronic kidney disease, stage 3 (moderate): N18.3

## 2015-12-24 HISTORY — DX: Chronic kidney disease, stage 3 unspecified: N18.30

## 2015-12-24 LAB — CBC
HEMATOCRIT: 38.7 % — AB (ref 40.0–52.0)
HEMOGLOBIN: 13.2 g/dL (ref 13.0–18.0)
MCH: 33.6 pg (ref 26.0–34.0)
MCHC: 34.2 g/dL (ref 32.0–36.0)
MCV: 98.3 fL (ref 80.0–100.0)
Platelets: 191 10*3/uL (ref 150–440)
RBC: 3.94 MIL/uL — AB (ref 4.40–5.90)
RDW: 15.2 % — ABNORMAL HIGH (ref 11.5–14.5)
WBC: 5.9 10*3/uL (ref 3.8–10.6)

## 2015-12-24 LAB — BASIC METABOLIC PANEL
ANION GAP: 8 (ref 5–15)
BUN: 19 mg/dL (ref 6–20)
CHLORIDE: 108 mmol/L (ref 101–111)
CO2: 26 mmol/L (ref 22–32)
Calcium: 8.8 mg/dL — ABNORMAL LOW (ref 8.9–10.3)
Creatinine, Ser: 1.61 mg/dL — ABNORMAL HIGH (ref 0.61–1.24)
GFR calc non Af Amer: 38 mL/min — ABNORMAL LOW (ref >60)
GFR, EST AFRICAN AMERICAN: 44 mL/min — AB (ref >60)
GLUCOSE: 118 mg/dL — AB (ref 65–99)
POTASSIUM: 3.8 mmol/L (ref 3.5–5.1)
Sodium: 142 mmol/L (ref 135–145)

## 2015-12-24 LAB — TROPONIN I: Troponin I: <0.03 ng/mL (ref <0.03)

## 2015-12-24 MED ORDER — HYDRALAZINE HCL 20 MG/ML IJ SOLN
INTRAMUSCULAR | Status: AC
  Administered 2015-12-24: 20 mg
  Filled 2015-12-24: qty 1

## 2015-12-24 MED ORDER — NICARDIPINE HCL IN NACL 20-0.86 MG/200ML-% IV SOLN
3.0000 mg/h | INTRAVENOUS | Status: DC
  Administered 2015-12-24: 5 mg/h via INTRAVENOUS
  Filled 2015-12-24: qty 200

## 2015-12-24 MED ORDER — ASPIRIN 81 MG PO CHEW
324.0000 mg | CHEWABLE_TABLET | Freq: Once | ORAL | Status: AC
  Administered 2015-12-24: 324 mg via ORAL
  Filled 2015-12-24: qty 4

## 2015-12-24 NOTE — ED Provider Notes (Signed)
[facility] Emergency Department Provider Note  Time seen: 7:10 PM  I have reviewed the triage vital signs and the nursing notes.   HISTORY  Chief Complaint Chest Pain    HPI Wesley Burton is a 82 y.o. male with a past medical history of hypertension, gout, arthritis who presents to the emergency department with chest pain for the past 3 days. According to the patient for the past 3 days he has been experiencing pressure in the mid to left chest somewhat worse with movement. States the pain got worse today so he came to the emergency department for evaluation. Patient does state occasionalshortness of breath, denies nausea or diaphoresis. Patient states he has been taking his blood pressure medication, but he has not checked his blood pressure in one or 2 weeks. States mild left chest pain currently.  Past Medical History:  Diagnosis Date  . Arthritis   . Gout   . Hypertension   . Prostate disease     There are no active problems to display for this patient.   Past Surgical History:  Procedure Laterality Date  . ABDOMINAL SURGERY    . SMALL INTESTINE SURGERY      Prior to Admission medications   Not on File    No Known Allergies  No family history on file.  Social History Social History  Substance Use Topics  . Smoking status: Current Every Day Smoker  . Smokeless tobacco: Never Used  . Alcohol use No    Review of Systems Constitutional: Negative for fever. Cardiovascular: Left chest pain. Respiratory: Occasional shortness of breath. Gastrointestinal: Negative for abdominal pain, vomiting and diarrhea. Musculoskeletal: Negative for back pain. Neurological: Negative for headaches, focal weakness or numbness. 10-point ROS otherwise negative.  ____________________________________________   PHYSICAL EXAM:  VITAL SIGNS: ED Triage Vitals  Enc Vitals Group     BP 12/24/15 1840 (!) 201/81     Pulse Rate 12/24/15 1840 (!) 53     Resp  12/24/15 1840 18     Temp 12/24/15 1840 99 F (37.2 C)     Temp Source 12/24/15 1840 Oral     SpO2 12/24/15 1840 99 %     Weight 12/24/15 1840 192 lb (87.1 kg)     Height 12/24/15 1840 5\' 5"  (1.651 m)     Head Circumference --      Peak Flow --      Pain Score 12/24/15 1841 0     Pain Loc --      Pain Edu? --      Excl. in [place]? --     Constitutional: Alert and oriented. Well appearing and in no distress. Eyes: Normal exam ENT   Head: Normocephalic and atraumatic.   Nose: No congestion/rhinnorhea.   Mouth/Throat: Mucous membranes are moist. Cardiovascular: Normal rate, regular rhythm. No murmurs, rubs, or gallops. Respiratory: Normal respiratory effort without tachypnea nor retractions. Breath sounds are clear Gastrointestinal: Soft and nontender. No distention.   Musculoskeletal: Nontender with normal range of motion in all extremities. No lower extremity tenderness or edema. Neurologic:  Normal speech and language. No gross focal neurologic deficits are appreciated. Skin:  Skin is warm, dry and intact.  Psychiatric: Mood and affect are normal. Speech and behavior are normal.   ____________________________________________    EKG  EKG reviewed and interpreted by myself shows normal sinus rhythm at 54 bpm, narrow QRS, normal axis, normal intervals, patient does have mild ST depression in 1 and aVL, possible lateral ischemia.  ____________________________________________    RADIOLOGY  Chest x-ray negative  ____________________________________________   INITIAL IMPRESSION / ASSESSMENT AND PLAN / ED COURSE  Pertinent labs & imaging results that were available during my care of the patient were reviewed by me and considered in my medical decision making (see chart for details).  Patient presents the emergency department for 2-3 days of left-sided chest pain. Patient is quite hypertensive 123456 systolic over 77 diastolic. We will dose 10 mg of hydralazine IV. Cluster  monitor while awaiting lab results. Patient does have a slight abnormality on his EKG with mild ST depression in lead 1 and aVL suggesting possible lateral ischemia. Patient is in no distress. States very mild discomfort at this time.   Patient's blood pressure currently [id number] systolic. States his chest pain is starting to come back again on the left side. Given the patient's age, significant hypertension and comorbidities we will admit to the hospital for chest pain workup.  ____________________________________________   FINAL CLINICAL IMPRESSION(S) / ED DIAGNOSES  Chest pain Hypertension    Harvest Dark, MD 12/24/15 2119

## 2015-12-24 NOTE — H&P (Signed)
[place] [facility] [place] NAME: Wesley Burton    MR#:  [id number]  DATE OF BIRTH:  [birth year]  DATE OF ADMISSION:  12/24/2015  PRIMARY CARE PHYSICIAN: Leonel Ramsay, MD   REQUESTING/REFERRING PHYSICIAN: Kerman Passey, MD  CHIEF COMPLAINT:   Chief Complaint  Patient presents with  . Chest Pain    HISTORY OF PRESENT ILLNESS:  Wesley Burton  is a 82 y.o. male who presents with 2 days of progressively worsening chest discomfort. He describes this pain as "just uncomfortable" located in the left side of his chest. He states that he has had similar pain in the right side of his chest that was due to gas. However, his typical antacid medications were not alleviating this pain. He came to the ED for evaluation and was found to have a significantly elevated blood pressure with a systolic as high as the low 200s. His initial lab and imaging workup was largely negative or within normal limits. However, his blood pressure remained difficult to control in the ED with IV medications. Hospitals were called for admission.  PAST MEDICAL HISTORY:   Past Medical History:  Diagnosis Date  . Arthritis   . CKD (chronic kidney disease), stage III   . Diabetes ([place])   . Gout   . Hypertension   . Prostate disease     PAST SURGICAL HISTORY:   Past Surgical History:  Procedure Laterality Date  . ABDOMINAL SURGERY    . SMALL INTESTINE SURGERY      SOCIAL HISTORY:   Social History  Substance Use Topics  . Smoking status: Current Every Day Smoker  . Smokeless tobacco: Never Used  . Alcohol use No    FAMILY HISTORY:   Family History  Problem Relation Age of Onset  . Heart attack Mother   . CAD Mother   . Diabetes Father   . Heart attack Father   . CAD Father     DRUG ALLERGIES:  No Known Allergies  MEDICATIONS AT HOME:   Prior to Admission medications   Not on File    REVIEW OF SYSTEMS:  Review of Systems  Constitutional:  Negative for chills, fever, malaise/fatigue and weight loss.  HENT: Negative for ear pain, hearing loss and tinnitus.   Eyes: Negative for blurred vision, double vision, pain and redness.  Respiratory: Negative for cough, hemoptysis and shortness of breath.   Cardiovascular: Positive for chest pain. Negative for palpitations, orthopnea and leg swelling.  Gastrointestinal: Negative for abdominal pain, constipation, diarrhea, nausea and vomiting.  Genitourinary: Negative for dysuria, frequency and hematuria.  Musculoskeletal: Negative for back pain, joint pain and neck pain.  Skin:       No acne, rash, or lesions  Neurological: Negative for dizziness, tremors, focal weakness and weakness.  Endo/Heme/Allergies: Negative for polydipsia. Does not bruise/bleed easily.  Psychiatric/Behavioral: Negative for depression. The patient is not nervous/anxious and does not have insomnia.      VITAL SIGNS:   Vitals:   12/24/15 1930 12/24/15 2000 12/24/15 2011 12/24/15 2030  BP: (!) 214/72 (!) 203/78 (!) 193/124 (!) 209/74  Pulse: (!) 48 (!) 59  (!) 58  Resp: 18 (!) 22  20  Temp:      TempSrc:      SpO2: 100% 99%  100%  Weight:      Height:       Wt Readings from Last 3 Encounters:  12/24/15 87.1 kg (192 lb)    PHYSICAL EXAMINATION:  Physical Exam  Vitals reviewed. Constitutional: He is oriented to person, place, and time. He appears well-developed and well-nourished. No distress.  HENT:  Head: Normocephalic and atraumatic.  Mouth/Throat: Oropharynx is clear and moist.  Eyes: Conjunctivae and EOM are normal. Pupils are equal, round, and reactive to light. No scleral icterus.  Neck: Normal range of motion. Neck supple. No JVD present. No thyromegaly present.  Cardiovascular: Normal rate, regular rhythm and intact distal pulses.  Exam reveals no gallop and no friction rub.   No murmur heard. Respiratory: Effort normal and breath sounds normal. No respiratory distress. He has no wheezes. He  has no rales.  GI: Soft. Bowel sounds are normal. He exhibits no distension. There is no tenderness.  Musculoskeletal: Normal range of motion. He exhibits no edema.  No arthritis, no gout  Lymphadenopathy:    He has no cervical adenopathy.  Neurological: He is alert and oriented to person, place, and time. No cranial nerve deficit.  No dysarthria, no aphasia  Skin: Skin is warm and dry. No rash noted. No erythema.  Psychiatric: He has a normal mood and affect. His behavior is normal. Judgment and thought content normal.    LABORATORY PANEL:   CBC  Recent Labs Lab 12/24/15 1845  WBC 5.9  HGB 13.2  HCT 38.7*  PLT 191   ------------------------------------------------------------------------------------------------------------------  Chemistries   Recent Labs Lab 12/24/15 1845  NA 142  K 3.8  CL 108  CO2 26  GLUCOSE 118*  BUN 19  CREATININE 1.61*  CALCIUM 8.8*   ------------------------------------------------------------------------------------------------------------------  Cardiac Enzymes  Recent Labs Lab 12/24/15 1845  TROPONINI <0.03   ------------------------------------------------------------------------------------------------------------------  RADIOLOGY:  Dg Chest 2 View  Result Date: 12/24/2015 CLINICAL DATA:  PT claims that he has had chest pain sense yesterday. Pt also states that his pain majority on the left side of the chest. EXAM: CHEST  2 VIEW COMPARISON:  08/29/2005 FINDINGS: Heart, mediastinum and hila are unremarkable. There is linear opacity in both lung bases consistent with scarring, atelectasis or a combination. Lungs otherwise clear. No pleural effusion or pneumothorax. Bony thorax is intact. IMPRESSION: No acute cardiopulmonary disease. Electronically Signed   By: Lajean Manes M.D.   On: 12/24/2015 19:08   EKG:   Orders placed or performed during the hospital encounter of 12/24/15  . EKG 12-Lead  . EKG 12-Lead  . ED EKG within 10  minutes  . ED EKG within 10 minutes    IMPRESSION AND PLAN:  Principal Problem:   Accelerated hypertension - without any significant overt endorgan damage on labs today except he does have borderline acute on chronic kidney injury. His blood pressure is difficult to control even with IV medications in the ED. We'll admit him on continuous infusion of nicardipine, with a goal blood pressure tonight of systolic less than 99991111, to be titrated down to a goal blood pressure less than 160/100 tomorrow. Active Problems:   Angina pectoris ([place]) - initial workup including troponin is negative or within normal limits. Suspect this is likely demand pain due to his high peripheral vascular resistance. However, we will trend his enzymes tonight and get an echocardiogram tomorrow.   Diabetes ([place]) - sliding scale insulin with corresponding glucose checks and carb modified heart healthy diet. Check hemoglobin A1c   BPH (benign prostatic hyperplasia) - continue home meds   CKD (chronic kidney disease), stage III - borderline acute injury, suspect this is likely due to his accelerated hypertension, avoid nephrotoxins, monitor closely.  All the records  are reviewed and case discussed with ED provider. Management plans discussed with the patient and/or family.  DVT PROPHYLAXIS: SubQ heparin  GI PROPHYLAXIS: None  ADMISSION STATUS: Inpatient  CODE STATUS: Full Code Status History    This patient does not have a recorded code status. Please follow your organizational policy for patients in this situation.      TOTAL TIME TAKING CARE OF THIS PATIENT: 45 minutes.    Sadako Cegielski [place] 12/24/2015, 9:34 PM  [facility]  Office  [phone]  CC: Primary care physician; Leonel Ramsay, MD
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
## 2015-12-24 NOTE — ED Triage Notes (Signed)
Patient presents to the ED with intermittent left sided chest pain x 2 days.  Patient denies any chest pain at this time.  Patient states, "I thought it was heart burn but it wasn't going away."  Patient reports the pain felt like a burning, sharp pain.  Patient states pain was worse with movement.  Patient is in no obvious distress at this time, hard of hearing.

## 2015-12-24 NOTE — ED Notes (Signed)
Pt states sob for "awhile" and placed on water pill. Then for the last few weeks chest pain on and off that was worse today. Pain is with movement only and has a hard time raising his arms to get his shirt off.

## 2015-12-25 ENCOUNTER — Inpatient Hospital Stay (HOSPITAL_COMMUNITY)
Admit: 2015-12-25 | Discharge: 2015-12-25 | Disposition: A | Discharge status: Home / Self Care | Payer: Medicare Other | Attending: Cardiovascular Disease | Admitting: Cardiovascular Disease

## 2015-12-25 DIAGNOSIS — N183 Chronic kidney disease, stage 3 (moderate): Secondary | ICD-10-CM

## 2015-12-25 DIAGNOSIS — I10 Essential (primary) hypertension: Secondary | ICD-10-CM

## 2015-12-25 DIAGNOSIS — I209 Angina pectoris, unspecified: Secondary | ICD-10-CM

## 2015-12-25 DIAGNOSIS — R079 Chest pain, unspecified: Secondary | ICD-10-CM

## 2015-12-25 DIAGNOSIS — E1159 Type 2 diabetes mellitus with other circulatory complications: Secondary | ICD-10-CM

## 2015-12-25 LAB — ECHOCARDIOGRAM COMPLETE
Height: 65 in
Weight: 3040.58 [oz_av]

## 2015-12-25 LAB — TROPONIN I
Troponin I: <0.03 ng/mL
Troponin I: <0.03 ng/mL
Troponin I: <0.03 ng/mL

## 2015-12-25 LAB — GLUCOSE, CAPILLARY
GLUCOSE-CAPILLARY: 131 mg/dL — AB (ref 65–99)
Glucose-Capillary: 111 mg/dL — ABNORMAL HIGH (ref 65–99)
Glucose-Capillary: 107 mg/dL — ABNORMAL HIGH (ref 65–99)
Glucose-Capillary: 106 mg/dL — ABNORMAL HIGH (ref 65–99)

## 2015-12-25 LAB — BASIC METABOLIC PANEL
ANION GAP: 8 (ref 5–15)
BUN: 15 mg/dL (ref 6–20)
CHLORIDE: 108 mmol/L (ref 101–111)
CO2: 25 mmol/L (ref 22–32)
Calcium: 8.8 mg/dL — ABNORMAL LOW (ref 8.9–10.3)
Creatinine, Ser: 1.28 mg/dL — ABNORMAL HIGH (ref 0.61–1.24)
GFR calc non Af Amer: 50 mL/min — ABNORMAL LOW (ref >60)
GFR, EST AFRICAN AMERICAN: 58 mL/min — AB (ref >60)
GLUCOSE: 109 mg/dL — AB (ref 65–99)
POTASSIUM: 3.8 mmol/L (ref 3.5–5.1)
Sodium: 141 mmol/L (ref 135–145)

## 2015-12-25 LAB — CBC
HCT: 40.7 % (ref 40.0–52.0)
Hemoglobin: 13.7 g/dL (ref 13.0–18.0)
MCH: 33.5 pg (ref 26.0–34.0)
MCHC: 33.7 g/dL (ref 32.0–36.0)
MCV: 99.4 fL (ref 80.0–100.0)
Platelets: 189 10*3/uL (ref 150–440)
RBC: 4.1 MIL/uL — ABNORMAL LOW (ref 4.40–5.90)
RDW: 15.2 % — ABNORMAL HIGH (ref 11.5–14.5)
WBC: 6.7 10*3/uL (ref 3.8–10.6)

## 2015-12-25 LAB — MRSA PCR SCREENING: MRSA by PCR: NEGATIVE

## 2015-12-25 LAB — HEMOGLOBIN A1C: Hgb A1c MFr Bld: 4.9 % (ref 4.0–6.0)

## 2015-12-25 MED ORDER — ALLOPURINOL 100 MG PO TABS
300.0000 mg | ORAL_TABLET | Freq: Every day | ORAL | Status: DC
  Administered 2015-12-25 – 2015-12-26 (×2): 300 mg via ORAL
  Filled 2015-12-25: qty 3
  Filled 2015-12-25: qty 1

## 2015-12-25 MED ORDER — ATENOLOL 25 MG PO TABS
25.0000 mg | ORAL_TABLET | Freq: Every day | ORAL | Status: DC
  Administered 2015-12-26: 25 mg via ORAL
  Filled 2015-12-25 (×2): qty 1

## 2015-12-25 MED ORDER — SODIUM CHLORIDE 0.9% FLUSH
3.0000 mL | Freq: Two times a day (BID) | INTRAVENOUS | Status: DC
  Administered 2015-12-25 – 2015-12-26 (×3): 3 mL via INTRAVENOUS

## 2015-12-25 MED ORDER — ONDANSETRON HCL 4 MG PO TABS: 4.0000 mg | ORAL_TABLET | Freq: Four times a day (QID) | ORAL | Status: DC | PRN

## 2015-12-25 MED ORDER — DOXAZOSIN MESYLATE 4 MG PO TABS
4.0000 mg | ORAL_TABLET | Freq: Every day | ORAL | Status: DC
  Administered 2015-12-25: 4 mg via ORAL
  Filled 2015-12-25 (×2): qty 1

## 2015-12-25 MED ORDER — MORPHINE SULFATE (PF) 2 MG/ML IV SOLN: 2.0000 mg | INTRAVENOUS | Status: DC | PRN

## 2015-12-25 MED ORDER — HEPARIN SODIUM (PORCINE) 5000 UNIT/ML IJ SOLN
5000.0000 [IU] | Freq: Three times a day (TID) | INTRAMUSCULAR | Status: DC
  Administered 2015-12-25 – 2015-12-26 (×4): 5000 [IU] via SUBCUTANEOUS
  Filled 2015-12-25 (×4): qty 1

## 2015-12-25 MED ORDER — ACETAMINOPHEN 325 MG PO TABS
650.0000 mg | ORAL_TABLET | Freq: Four times a day (QID) | ORAL | Status: DC | PRN
  Administered 2015-12-25: 650 mg via ORAL
  Filled 2015-12-25: qty 2

## 2015-12-25 MED ORDER — LISINOPRIL 20 MG PO TABS
40.0000 mg | ORAL_TABLET | Freq: Every day | ORAL | Status: DC
  Administered 2015-12-25 – 2015-12-26 (×2): 40 mg via ORAL
  Filled 2015-12-25 (×2): qty 2

## 2015-12-25 MED ORDER — INSULIN ASPART 100 UNIT/ML ~~LOC~~ SOLN
0.0000 [IU] | Freq: Three times a day (TID) | SUBCUTANEOUS | Status: DC
  Administered 2015-12-25: 1 [IU] via SUBCUTANEOUS
  Administered 2015-12-26: 2 [IU] via SUBCUTANEOUS
  Filled 2015-12-25: qty 2
  Filled 2015-12-25: qty 1

## 2015-12-25 MED ORDER — ACETAMINOPHEN 650 MG RE SUPP: 650.0000 mg | Freq: Four times a day (QID) | RECTAL | Status: DC | PRN

## 2015-12-25 MED ORDER — ISOSORBIDE MONONITRATE ER 30 MG PO TB24
30.0000 mg | ORAL_TABLET | Freq: Two times a day (BID) | ORAL | Status: DC
  Administered 2015-12-25 – 2015-12-26 (×2): 30 mg via ORAL
  Filled 2015-12-25 (×2): qty 1

## 2015-12-25 MED ORDER — FINASTERIDE 5 MG PO TABS
5.0000 mg | ORAL_TABLET | Freq: Every day | ORAL | Status: DC
  Administered 2015-12-25 – 2015-12-26 (×2): 5 mg via ORAL
  Filled 2015-12-25 (×2): qty 1

## 2015-12-25 MED ORDER — ONDANSETRON HCL 4 MG/2ML IJ SOLN: 4.0000 mg | Freq: Four times a day (QID) | INTRAMUSCULAR | Status: DC | PRN

## 2015-12-25 MED ORDER — NIFEDIPINE ER OSMOTIC RELEASE 90 MG PO TB24
90.0000 mg | ORAL_TABLET | Freq: Every day | ORAL | Status: DC
  Administered 2015-12-25 – 2015-12-26 (×2): 90 mg via ORAL
  Filled 2015-12-25: qty 1
  Filled 2015-12-25: qty 3

## 2015-12-25 MED ORDER — INSULIN ASPART 100 UNIT/ML ~~LOC~~ SOLN: 0.0000 [IU] | Freq: Every day | SUBCUTANEOUS | Status: DC

## 2015-12-25 MED ORDER — DOXAZOSIN MESYLATE 4 MG PO TABS
4.0000 mg | ORAL_TABLET | Freq: Two times a day (BID) | ORAL | Status: DC
  Administered 2015-12-26: 4 mg via ORAL
  Filled 2015-12-25: qty 1

## 2015-12-25 MED ORDER — OXYCODONE HCL 5 MG PO TABS: 5.0000 mg | ORAL_TABLET | ORAL | Status: DC | PRN

## 2015-12-25 MED ORDER — ATENOLOL 25 MG PO TABS
50.0000 mg | ORAL_TABLET | Freq: Every day | ORAL | Status: DC
  Administered 2015-12-25: 50 mg via ORAL
  Filled 2015-12-25: qty 1

## 2015-12-25 NOTE — Progress Notes (Signed)
Report given to 2a nurse, patient to transfer shortly. Wesley Burton

## 2015-12-25 NOTE — Progress Notes (Signed)
Patient transferred from ICU. Complaining of some chest soreness, given tylenol. BP is also elevated, but imdur has been added by cardiology and given. Wife is at bedside. Sinus brady in the upper 50's on tele. Will continue to monitor.

## 2015-12-25 NOTE — Care Management Note (Signed)
Case Management Note  Patient Details  Name: Wesley Burton MRN: [id number] Date of Birth: 07/07/1930  Subjective/Objective:                  Met with patient and daughter Wesley Burton. He is a little hard of hearing without hearing aides- family to bring them in. He is alert and oriented. He states he is from home with his wife. His PCP is Dr. Fitzgerald. His pharmacy is [facility]. He states he had been driving and sometimes uses a cane or walker to ambulate. He denies problems accessing medical care or medications. He is considering home health or SNF for rehab if needed. There was not plans for long-term placement at ALF per patient and daughter.  Action/Plan: List of home health agencies left with patient. RNCM will continue to follow.   Expected Discharge Date:                  Expected Discharge Plan:     In-House Referral:     Discharge planning Services     Post Acute Care Choice:  Home Health Choice offered to:  Patient, Adult Children  DME Arranged:    DME Agency:     HH Arranged:    HH Agency:     Status of Service:  In process, will continue to follow  If discussed at Long Length of Stay Meetings, dates discussed:    Additional Comments:   , RN 12/25/2015, 11:33 AM  

## 2015-12-25 NOTE — Progress Notes (Signed)
*  PRELIMINARY RESULTS* Echocardiogram 2D Echocardiogram has been performed.  Sherrie Sport 12/25/2015, 3:48 PM

## 2015-12-25 NOTE — Consult Note (Signed)
Cardiology Consultation Note  Patient ID: Wesley Burton, MRN: [id number], DOB/AGE: [birth year] 80 y.o. Admit date: 12/24/2015   Date of Consult: 12/25/2015 Primary Physician: Leonel Ramsay, MD Primary Cardiologist: New to [facility] - previously saw Dr. Josefa Half, MD in 2007 Requesting Physician: Dr. Manuella Ghazi, MD  Chief Complaint: Chest pain Reason for Consult: same  HPI: 82 y.o. male with h/o CKD stage III, poorly controlled HTN, DM2, gout, BPH who presented to [facility] with chest pain. He was found to have malignant HTn with SBP 230. Cardiology is consulted for chest pain.   Patient previously saw Dr. Josefa Half, MD in 2007 for chest pain. He reports ahving had a stress test then, results are not available to review. 2 days agao he began to develop left-sided chest pain without associated symptoms. Pain was pressure-like. Not exertional.   Upon the patient's arrival to [facility] they were found to have SBP of 230. He was started on nicardipine gtt and continued on home BP meds. Troponin negative x 3, SCr 1.61-->1.28, K+ 3.8, WBC 6.7, HGB 13.7  ECG as below, CXR showed no acute process. BP has improved to the [id number] systolic to date. Currently, chest pain free.   Home BP in the 140's he reports. He denies missing any doses of his medications.   Past Medical History:  Diagnosis Date  . Arthritis   . CKD (chronic kidney disease), stage III   . Diabetes ([place])   . Gout   . Hypertension   . Prostate disease       Most Recent Cardiac Studies: None available to review   Surgical History:  Past Surgical History:  Procedure Laterality Date  . ABDOMINAL SURGERY    . SMALL INTESTINE SURGERY       Home Meds: Prior to Admission medications   Medication Sig Start Date End Date Taking? Authorizing Provider  allopurinol (ZYLOPRIM) 300 MG tablet Take 300 mg by mouth daily. 10/15/15 10/14/16 Yes Historical Provider, MD  atenolol (TENORMIN) 50 MG tablet Take 50 mg by mouth daily. 03/18/15  Yes Historical  Provider, MD  doxazosin (CARDURA) 4 MG tablet Take 4 mg by mouth daily. 09/15/15  Yes Historical Provider, MD  finasteride (PROSCAR) 5 MG tablet Take 5 mg by mouth daily. 09/15/15  Yes Historical Provider, MD  fluticasone (FLONASE) 50 MCG/ACT nasal spray Place 2 sprays into the nose daily as needed for allergies.  09/10/14  Yes Historical Provider, MD  lisinopril (PRINIVIL,ZESTRIL) 40 MG tablet Take 40 mg by mouth daily. 08/27/15 08/26/16 Yes Historical Provider, MD  NIFEdipine (ADALAT CC) 90 MG 24 hr tablet Take 90 mg by mouth daily. 09/15/15  Yes Historical Provider, MD    Inpatient Medications:  . allopurinol  300 mg Oral Daily  . atenolol  50 mg Oral Daily  . doxazosin  4 mg Oral Daily  . finasteride  5 mg Oral Daily  . heparin  5,000 Units Subcutaneous Q8H  . insulin aspart  0-5 Units Subcutaneous QHS  . insulin aspart  0-9 Units Subcutaneous TID WC  . lisinopril  40 mg Oral Daily  . sodium chloride flush  3 mL Intravenous Q12H   . niCARDipine Stopped (12/25/15 [id number])    Allergies: No Known Allergies  Social History   Social History  . Marital status: Married    Spouse name: N/A  . Number of children: N/A  . Years of education: N/A   Occupational History  . Not on file.   Social History Main Topics  . Smoking  status: Current Every Day Smoker  . Smokeless tobacco: Never Used  . Alcohol use No  . Drug use: No  . Sexual activity: Not on file   Other Topics Concern  . Not on file   Social History Narrative  . No narrative on file     Family History  Problem Relation Age of Onset  . Heart attack Mother   . CAD Mother   . Diabetes Father   . Heart attack Father   . CAD Father      Review of Systems: Review of Systems  Constitutional: Positive for malaise/fatigue. Negative for chills, diaphoresis, fever and weight loss.  HENT: Negative for congestion.   Eyes: Negative for discharge and redness.  Respiratory: Negative for cough, hemoptysis, sputum production,  shortness of breath and wheezing.   Cardiovascular: Positive for chest pain. Negative for palpitations, orthopnea, claudication, leg swelling and PND.  Gastrointestinal: Negative for abdominal pain, heartburn, nausea and vomiting.  Musculoskeletal: Negative for falls and myalgias.  Skin: Negative for rash.  Neurological: Positive for weakness and headaches. Negative for dizziness, tingling, tremors, sensory change, speech change, focal weakness and loss of consciousness.  Endo/Heme/Allergies: Does not bruise/bleed easily.  Psychiatric/Behavioral: Negative for substance abuse. The patient is not nervous/anxious.   All other systems reviewed and are negative.   Labs:  Recent Labs  12/24/15 1845 12/25/15 0132 12/25/15 0736  TROPONINI <0.03 <0.03 <0.03   Lab Results  Component Value Date   WBC 6.7 12/25/2015   HGB 13.7 12/25/2015   HCT 40.7 12/25/2015   MCV 99.4 12/25/2015   PLT 189 12/25/2015     Recent Labs Lab 12/25/15 0736  NA 141  K 3.8  CL 108  CO2 25  BUN 15  CREATININE 1.28*  CALCIUM 8.8*  GLUCOSE 109*   No results found for: CHOL, HDL, LDLCALC, TRIG No results found for: DDIMER  Radiology/Studies:  Dg Chest 2 View  Result Date: 12/24/2015 CLINICAL DATA:  PT claims that he has had chest pain sense yesterday. Pt also states that his pain majority on the left side of the chest. EXAM: CHEST  2 VIEW COMPARISON:  08/29/2005 FINDINGS: Heart, mediastinum and hila are unremarkable. There is linear opacity in both lung bases consistent with scarring, atelectasis or a combination. Lungs otherwise clear. No pleural effusion or pneumothorax. Bony thorax is intact. IMPRESSION: No acute cardiopulmonary disease. Electronically Signed   By: Lajean Manes M.D.   On: 12/24/2015 19:08   EKG: Interpreted by me showed: sinus bradycardia, 54 bpm, LVH, nonspecific st/t changes along lateral leads Telemetry: Interpreted by me showed: sinus bradycardia  Weights: Filed Weights    12/24/15 1840 12/25/15 0042  Weight: 192 lb (87.1 kg) 190 lb 0.6 oz (86.2 kg)     Physical Exam: Blood pressure (!) 162/77, pulse 60, temperature 98.5 F (36.9 C), temperature source Oral, resp. rate 18, height 5\' 5"  (1.651 m), weight 190 lb 0.6 oz (86.2 kg), SpO2 98 %. Body mass index is 31.62 kg/m. General: Well developed, well nourished, in no acute distress. Head: Normocephalic, atraumatic, sclera non-icteric, no xanthomas, nares are without discharge. HOH.   Neck: Negative for carotid bruits. JVD not elevated. Lungs: Clear bilaterally to auscultation without wheezes, rales, or rhonchi. Breathing is unlabored. Heart: RRR with S1 S2. No murmurs, rubs, or gallops appreciated. Abdomen: Soft, non-tender, non-distended with normoactive bowel sounds. No hepatomegaly. No rebound/guarding. No obvious abdominal masses. Msk:  Strength and tone appear normal for age. Extremities: No clubbing or cyanosis. No  edema. Distal pedal pulses are 2+ and equal bilaterally. Neuro: Alert and oriented X 3. No facial asymmetry. No focal deficit. Moves all extremities spontaneously. Psych:  Responds to questions appropriately with a normal affect.    Assessment and Plan:  Principal Problem:   Essential hypertension, malignant Active Problems:   CKD (chronic kidney disease), stage III   Diabetes ([facility])   Angina pectoris ([facility])   BPH (benign prostatic hyperplasia)    1. Chest pain: -Ruled out -Likely in the setting of his malignant HTN -Currently chest pain free -Nuclear stress test 7/29 -Check echo   2. Malignant HTN: -Improving on nicardipine gtt -Titrate antihypertensives to goal BP  3. CKD stage II-III: -Improving -Per IM  4. DM2: -Per IM  5. Cognitive decline: -Per IM   Signed, Christell Faith, PA-C [facility] Pager: [phone] 12/25/2015, 11:14 AM

## 2015-12-25 NOTE — Clinical Social Work Note (Signed)
CSW consulted by RN CM for placement to ALF per family request. Rn CM spoke with patient's daughter after that consult was placed and the daughter stated that they had not mentioned and had no plans for patient to go to ALF. At this time, CSW will await a PT consult to see if they recommend any change in level of care for patient who is from home. Shela Leff MSW,LCSW [phone]

## 2015-12-25 NOTE — Progress Notes (Signed)
Patient's heart rate has been in the upper 50's for today according to ICU nurse and also once arriving to 2A. Currently the heart rate is going as low as 45, but is maintaining around 48. Dr. Lovena Le with cardiology paged and MD stated to put hold parameters on atenolol to not give if heart rate below 55. Will continue to monitor.

## 2015-12-25 NOTE — Progress Notes (Signed)
[place] at [place] NAME: Wesley Burton    MR#:  [id number]  DATE OF BIRTH:  [birth year]  SUBJECTIVE:  CHIEF COMPLAINT:   Chief Complaint  Patient presents with  . Chest Pain  hard of hearing. No complaints, BP improving. Chest pain is resolved REVIEW OF SYSTEMS:  Review of Systems  Constitutional: Negative for chills, fever and weight loss.  HENT: Negative for nosebleeds and sore throat.   Eyes: Negative for blurred vision.  Respiratory: Negative for cough, shortness of breath and wheezing.   Cardiovascular: Positive for chest pain. Negative for orthopnea, leg swelling and PND.  Gastrointestinal: Negative for abdominal pain, constipation, diarrhea, heartburn, nausea and vomiting.  Genitourinary: Negative for dysuria and urgency.  Musculoskeletal: Negative for back pain.  Skin: Negative for rash.  Neurological: Negative for dizziness, speech change, focal weakness and headaches.  Endo/Heme/Allergies: Does not bruise/bleed easily.  Psychiatric/Behavioral: Negative for depression.   DRUG ALLERGIES:  No Known Allergies VITALS:  Blood pressure (!) 156/49, pulse (!) 57, temperature 98.2 F (36.8 C), temperature source Oral, resp. rate (!) 21, height 5\' 5"  (1.651 m), weight 86.2 kg (190 lb 0.6 oz), SpO2 99 %. PHYSICAL EXAMINATION:  Physical Exam  Constitutional: He is oriented to person, place, and time and well-developed, well-nourished, and in no distress.  HENT:  Head: Normocephalic and atraumatic.  Eyes: Conjunctivae and EOM are normal. Pupils are equal, round, and reactive to light.  Neck: Normal range of motion. Neck supple. No tracheal deviation present. No thyromegaly present.  Cardiovascular: Normal rate, regular rhythm and normal heart sounds.   Pulmonary/Chest: Effort normal and breath sounds normal. No respiratory distress. He has no wheezes. He exhibits no tenderness.  Abdominal: Soft. Bowel sounds are normal. He exhibits no  distension. There is no tenderness.  Musculoskeletal: Normal range of motion.  Neurological: He is alert and oriented to person, place, and time. No cranial nerve deficit.  Skin: Skin is warm and dry. No rash noted.  Psychiatric: Mood and affect normal.   LABORATORY PANEL:   CBC  Recent Labs Lab 12/25/15 0736  WBC 6.7  HGB 13.7  HCT 40.7  PLT 189   ------------------------------------------------------------------------------------------------------------------ Chemistries   Recent Labs Lab 12/25/15 0736  NA 141  K 3.8  CL 108  CO2 25  GLUCOSE 109*  BUN 15  CREATININE 1.28*  CALCIUM 8.8*   RADIOLOGY:  Dg Chest 2 View  Result Date: 12/24/2015 CLINICAL DATA:  PT claims that he has had chest pain sense yesterday. Pt also states that his pain majority on the left side of the chest. EXAM: CHEST  2 VIEW COMPARISON:  08/29/2005 FINDINGS: Heart, mediastinum and hila are unremarkable. There is linear opacity in both lung bases consistent with scarring, atelectasis or a combination. Lungs otherwise clear. No pleural effusion or pneumothorax. Bony thorax is intact. IMPRESSION: No acute cardiopulmonary disease. Electronically Signed   By: Lajean Manes M.D.   On: 12/24/2015 19:08  ASSESSMENT AND PLAN:  82 y.o. male who presents with 2 days of progressively worsening chest discomfort. He describes this pain as "just uncomfortable" located in the left side of his chest. He states that he has had similar pain in the right side of his chest that was due to gas  * Accelerated hypertension - Off nicardipine - continue atenolol, cardura, lisinopril and nifedipine - adjust dose as need  * Chest pain -  likely demand pain due to uncontrolled HTN. troponins neg  * Diabetes (  [place]) - sliding scale insulin with corresponding glucose checks and carb modified heart healthy diet.  - sugars well controlled  * BPH (benign prostatic hyperplasia) - continue home meds   * CKD (chronic kidney  disease), stage III -  - likely due to his accelerated hypertension, avoid nephrotoxins, monitor closely.     All the records are reviewed and case discussed with Care Management/Social Worker. Management plans discussed with the patient, family and they are in agreement.  CODE STATUS: FULL CODE  TOTAL TIME TAKING CARE OF THIS PATIENT: 35 minutes.   More than 50% of the time was spent in counseling/coordination of care: YES  POSSIBLE D/C IN 1-2 DAYS, DEPENDING ON CLINICAL CONDITION.   [facility], Ciria Bernardini M.D on 12/25/2015 at 1:56 PM  Between 7am to 6pm - Pager - [phone]  After 6pm go to [email or web address] - Technical brewer [facility]  Office  [phone]  CC: Primary care physician; FITZGERALD, DAVID Mamie Nick, MD  Note: This dictation was prepared with Dragon dictation along with smaller phrase technology. Any transcriptional errors that result from this process are unintentional.

## 2015-12-26 ENCOUNTER — Other Ambulatory Visit: Payer: Medicare Other

## 2015-12-26 DIAGNOSIS — R0789 Other chest pain: Secondary | ICD-10-CM

## 2015-12-26 LAB — BASIC METABOLIC PANEL
Anion gap: 9 (ref 5–15)
BUN: 20 mg/dL (ref 6–20)
CHLORIDE: 105 mmol/L (ref 101–111)
CO2: 25 mmol/L (ref 22–32)
CREATININE: 1.33 mg/dL — AB (ref 0.61–1.24)
Calcium: 8.8 mg/dL — ABNORMAL LOW (ref 8.9–10.3)
GFR calc Af Amer: 55 mL/min — ABNORMAL LOW (ref >60)
GFR calc non Af Amer: 48 mL/min — ABNORMAL LOW (ref >60)
GLUCOSE: 112 mg/dL — AB (ref 65–99)
Potassium: 3.7 mmol/L (ref 3.5–5.1)
SODIUM: 139 mmol/L (ref 135–145)

## 2015-12-26 LAB — CBC
HEMATOCRIT: 38.4 % — AB (ref 40.0–52.0)
HEMOGLOBIN: 13.3 g/dL (ref 13.0–18.0)
MCH: 34.2 pg — AB (ref 26.0–34.0)
MCHC: 34.7 g/dL (ref 32.0–36.0)
MCV: 98.7 fL (ref 80.0–100.0)
Platelets: 184 10*3/uL (ref 150–440)
RBC: 3.89 MIL/uL — ABNORMAL LOW (ref 4.40–5.90)
RDW: 15.5 % — ABNORMAL HIGH (ref 11.5–14.5)
WBC: 6.5 10*3/uL (ref 3.8–10.6)

## 2015-12-26 LAB — LIPID PANEL
CHOL/HDL RATIO: 3.2 ratio
Cholesterol: 145 mg/dL (ref 0–200)
HDL: 45 mg/dL (ref >40)
LDL CALC: 85 mg/dL (ref 0–99)
Triglycerides: 77 mg/dL (ref <150)
VLDL: 15 mg/dL (ref 0–40)

## 2015-12-26 LAB — GLUCOSE, CAPILLARY
GLUCOSE-CAPILLARY: 186 mg/dL — AB (ref 65–99)
Glucose-Capillary: 103 mg/dL — ABNORMAL HIGH (ref 65–99)

## 2015-12-26 MED ORDER — ISOSORBIDE MONONITRATE ER 30 MG PO TB24: 30.0000 mg | ORAL_TABLET | Freq: Two times a day (BID) | ORAL | 0 refills | Status: DC

## 2015-12-26 MED ORDER — ASPIRIN 81 MG PO TBEC: 81.0000 mg | DELAYED_RELEASE_TABLET | Freq: Every day | ORAL | Status: AC

## 2015-12-26 MED ORDER — ATENOLOL 50 MG PO TABS: 25.0000 mg | ORAL_TABLET | Freq: Every day | ORAL | 0 refills | Status: AC

## 2015-12-26 MED ORDER — SIMVASTATIN 40 MG PO TABS: 40.0000 mg | ORAL_TABLET | Freq: Every day | ORAL | Status: DC

## 2015-12-26 MED ORDER — ASPIRIN EC 81 MG PO TBEC
81.0000 mg | DELAYED_RELEASE_TABLET | Freq: Every day | ORAL | Status: DC
  Administered 2015-12-26: 81 mg via ORAL
  Filled 2015-12-26: qty 1

## 2015-12-26 NOTE — Progress Notes (Signed)
Patient Name: Wesley Burton Date of Encounter: 12/26/2015  Patient Care Team: Leonel Ramsay, MD as PCP - General (Infectious Diseases)  PROBLEM LIST  Principal Problem:   Essential hypertension, malignant Active Problems:   Diabetes ([place])   BPH (benign prostatic hyperplasia)   Angina pectoris ([place])   CKD (chronic kidney disease), stage III   Chest pain with moderate risk for cardiac etiology     PATIENT SUMMARY    SUBJECTIVE  Patient doing better today. BP improved. He is a poor historian. He adamantly refused to proceed with a stress test today despite having had that discussion with the cardiologist yesterday. He eventually changes mind but it was too late, schedule wise. He mentions chest pain with change in position especially when turning in bed. He denies chest pain with walking. He denies shortness of breath. He says he would rather have the stress test done as an outpatient.   CURRENT MEDS . allopurinol  300 mg Oral Daily  . atenolol  25 mg Oral Daily  . doxazosin  4 mg Oral Q12H  . finasteride  5 mg Oral Daily  . heparin  5,000 Units Subcutaneous Q8H  . insulin aspart  0-5 Units Subcutaneous QHS  . insulin aspart  0-9 Units Subcutaneous TID WC  . isosorbide mononitrate  30 mg Oral BID  . lisinopril  40 mg Oral Daily  . NIFEdipine  90 mg Oral Daily  . sodium chloride flush  3 mL Intravenous Q12H    OBJECTIVE  Vitals:   12/25/15 1718 12/25/15 1927 12/26/15 0444 12/26/15 0922  BP: (!) 156/67 132/67 129/66   Pulse: (!) 49 (!) 51 65 62  Resp:  18 18   Temp:  98.1 F (36.7 C) 97.8 F (36.6 C)   TempSrc:  Oral    SpO2: 96% 96% 93% 97%  Weight:      Height:        Intake/Output Summary (Last 24 hours) at 12/26/15 1044 Last data filed at 12/26/15 1000  Gross per 24 hour  Intake              400 ml  Output              625 ml  Net             -225 ml   Filed Weights   12/24/15 1840 12/25/15 0042 12/25/15 1504  Weight: 192 lb (87.1 kg) 190 lb  0.6 oz (86.2 kg) 186 lb (84.4 kg)    PHYSICAL EXAM VS:  BP 129/66 (BP Location: Left Arm)   Pulse 62   Temp 97.8 F (36.6 C)   Resp 18   Ht 5\' 5"  (1.651 m)   Wt 186 lb (84.4 kg)   SpO2 97%   BMI 30.95 kg/m  , BMI Body mass index is 30.95 kg/m. GENERAL:  well developed, well nourished, obese, not in acute distress HEENT: normocephalic, pink conjunctivae, anicteric sclerae, no xanthelasma, normal dentition, oropharynx clear NECK:  no neck vein engorgement, JVP normal, no hepatojugular reflux, carotid upstroke brisk and symmetric, no bruit, no thyromegaly, no lymphadenopathy LUNGS:  good respiratory effort, clear to auscultation bilaterally CV:  PMI not displaced, no thrills, no lifts, S1 and S2 within normal limits, no palpable S3 or S4, no murmurs, no rubs, no gallops ABD:  Soft, nontender, nondistended, normoactive bowel sounds, no abdominal aortic bruit, no hepatomegaly, no splenomegaly MS: nontender back, no kyphosis, no scoliosis, no joint deformities EXT:  2+ DP/PT  pulses, no edema, no varicosities, no cyanosis, no clubbing SKIN: warm, nondiaphoretic, normal turgor, no ulcers NEUROPSYCH: alert, oriented to person, place, and time, sensory/motor grossly intact, normal mood, appropriate affect   Accessory Clinical Findings  CBC  Recent Labs  12/25/15 0736 12/26/15 0556  WBC 6.7 6.5  HGB 13.7 13.3  HCT 40.7 38.4*  MCV 99.4 98.7  PLT 189 [id number]   Basic Metabolic Panel  Recent Labs  12/25/15 0736 12/26/15 0556  NA 141 139  K 3.8 3.7  CL 108 105  CO2 25 25  GLUCOSE 109* 112*  BUN 15 20  CREATININE 1.28* 1.33*  CALCIUM 8.8* 8.8*   Liver Function Tests No results for input(s): AST, ALT, ALKPHOS, BILITOT, PROT, ALBUMIN in the last 72 hours. No results for input(s): LIPASE, AMYLASE in the last 72 hours. Cardiac Enzymes  Recent Labs  12/25/15 0132 12/25/15 0736 12/25/15 1210  TROPONINI <0.03 <0.03 <0.03   BNP (last 3 results) No results for input(s): BNP in  the last 8760 hours. D-Dimer No results for input(s): DDIMER in the last 72 hours. Hemoglobin A1C  Recent Labs  12/25/15 0736  HGBA1C 4.9   Fasting Lipid Panel No results for input(s): CHOL, HDL, LDLCALC, TRIG, CHOLHDL, LDLDIRECT in the last 72 hours. Thyroid Function Tests No results for input(s): TSH, T4TOTAL, T3FREE, THYROIDAB in the last 72 hours.  Invalid input(s): FREET3  TELE  SR, with min HR in 40s - 50s    RADIOLOGY/STUDIES  Dg Chest 2 View  Result Date: 12/24/2015 CLINICAL DATA:  PT claims that he has had chest pain sense yesterday. Pt also states that his pain majority on the left side of the chest. EXAM: CHEST  2 VIEW COMPARISON:  08/29/2005 FINDINGS: Heart, mediastinum and hila are unremarkable. There is linear opacity in both lung bases consistent with scarring, atelectasis or a combination. Lungs otherwise clear. No pleural effusion or pneumothorax. Bony thorax is intact. IMPRESSION: No acute cardiopulmonary disease. Electronically Signed   By: Lajean Manes M.D.   On: 12/24/2015 19:08  Echo 12/25/2015: Left ventricle: The cavity size was normal. There was mild to   moderate concentric hypertrophy. Systolic function was normal.   The estimated ejection fraction was in the range of 50% to 55%.   Hypokinesis of the inferior myocardium. Doppler parameters are   consistent with abnormal left ventricular relaxation (grade 1   diastolic dysfunction). - Aortic valve: There was mild regurgitation. - Right ventricle: Systolic function was normal. - Pulmonary arteries: Systolic pressure was within the normal   range.  ASSESSMENT AND PLAN Chest pain: -Ruled out for ACS -Likely in the setting of malignant HTN -Currently no clear angina -Nuclear stress test was scheduled today due to concern of wall motion abnormality on echocardiogram. However, patient adamantly refused to go down for the stress test today. When he changed his mind several hours after, it was too  late to reschedule the test. Patient is aware of risks of not having the test done. He does not have any ongoing angina currently. The chest pain he complains of is more positional in nature. He says he would do it as an outpatient. Patient is on beta blocker, Imdur, anyway. Recommend aspirin 81 mg by mouth daily. Recommend statin therapy. Patient has indication for LDL less than 70 (DM).  2. Malignant HTN: -Improved -Titrate antihypertensives to goal BP Continue atenolol 25mg  po qd with holding parameters, cardura 4mg  po qd, Imdur ER 30, Nifedipine 90, Lisinopril 40  3. CKD stage  II-III: -Improving -Per IM   Total encounter time > 8minutes.   Signed, Wende Bushy, MD  12/26/2015, 10:44 AM  [place]

## 2015-12-26 NOTE — Discharge Instructions (Signed)
Low salt heart healthy diet  Resume regular activity  Follow up with your doctor in 1 week.  It is important you take all your medications as directed.

## 2015-12-28 NOTE — Discharge Summary (Signed)
[place] at [place] NAME: Wesley Burton    MR#:  [id number]  DATE OF BIRTH:  1930-06-11  DATE OF ADMISSION:  12/24/2015 ADMITTING PHYSICIAN: Lance Coon, MD  DATE OF DISCHARGE: 12/26/2015  2:32 PM  PRIMARY CARE PHYSICIAN: Leonel Ramsay, MD   ADMISSION DIAGNOSIS:  Essential hypertension [I10] Chest pain, unspecified chest pain type [R07.9]  DISCHARGE DIAGNOSIS:  Principal Problem:   Essential hypertension, malignant Active Problems:   Diabetes ([place])   BPH (benign prostatic hyperplasia)   Angina pectoris ([facility])   CKD (chronic kidney disease), stage III   Chest pain with moderate risk for cardiac etiology   SECONDARY DIAGNOSIS:   Past Medical History:  Diagnosis Date  . Arthritis   . CKD (chronic kidney disease), stage III   . Diabetes ([place])   . Gout   . Hypertension   . Prostate disease      ADMITTING HISTORY  Wesley Burton  is a 82 y.o. male who presents with 2 days of progressively worsening chest discomfort. He describes this pain as "just uncomfortable" located in the left side of his chest. He states that he has had similar pain in the right side of his chest that was due to gas. However, his typical antacid medications were not alleviating this pain. He came to the ED for evaluation and was found to have a significantly elevated blood pressure with a systolic as high as the low 200s. His initial lab and imaging workup was largely negative or within normal limits. However, his blood pressure remained difficult to control in the ED with IV medications. Hospitals were called for admission.  HOSPITAL COURSE:   82 y.o.malewho presents with 2 days of progressively worsening chest discomfort. He describes this pain as "just uncomfortable" located in the left side of his chest. He states that he has had similar pain in the right side of his chest that was due to gas  * Accelerated hypertension - Off nicardipine -  continue atenolol, cardura, lisinopril and nifedipine, Imdur  *Chest pain -  likely demand pain due to uncontrolled HTN. troponins neg  Patient will need outpatient stress test with Dr. Rockey Situ. He was seen by Dr. Farrel Conners of cardiology on day of discharge. Troponins have been normal. He does have chest pain but pleuritic pain since his fall landing on his chest.  *Diabetes ([facility]) - sliding scale insulin with corresponding glucose checks and carb modified heart healthy diet.  - sugars well controlled  *BPH (benign prostatic hyperplasia) - continue home meds  *CKD (chronic kidney disease), stage III -  - likely due to his accelerated hypertension, avoid nephrotoxins, monitor closely. Stable  Stable for discharge home to follow-up with primary care physician and cardiology.   CONSULTS OBTAINED:  Treatment Team:  Minna Merritts, MD  DRUG ALLERGIES:  No Known Allergies  DISCHARGE MEDICATIONS:   Discharge Medication List as of 12/26/2015 12:28 PM    START taking these medications   Details  aspirin EC 81 MG EC tablet Take 1 tablet (81 mg total) by mouth daily., Starting Sat 12/26/2015, OTC    isosorbide mononitrate (IMDUR) 30 MG 24 hr tablet Take 1 tablet (30 mg total) by mouth 2 (two) times daily., Starting Sat 12/26/2015, Normal      CONTINUE these medications which have CHANGED   Details  atenolol (TENORMIN) 50 MG tablet Take 0.5 tablets (25 mg total) by mouth daily., Starting Sat 12/26/2015, Normal  CONTINUE these medications which have NOT CHANGED   Details  allopurinol (ZYLOPRIM) 300 MG tablet Take 300 mg by mouth daily., Starting Thu 10/15/2015, Until Fri 10/14/2016, Historical Med    doxazosin (CARDURA) 4 MG tablet Take 4 mg by mouth daily., Starting Tue 09/15/2015, Historical Med    finasteride (PROSCAR) 5 MG tablet Take 5 mg by mouth daily., Starting Tue 09/15/2015, Historical Med    fluticasone (FLONASE) 50 MCG/ACT nasal spray Place 2 sprays into the nose  daily as needed for allergies. , Starting Wed 09/10/2014, Historical Med    lisinopril (PRINIVIL,ZESTRIL) 40 MG tablet Take 40 mg by mouth daily., Starting Thu 08/27/2015, Until Fri 08/26/2016, Historical Med    NIFEdipine (ADALAT CC) 90 MG 24 hr tablet Take 90 mg by mouth daily., Starting Tue 09/15/2015, Historical Med        Today   VITAL SIGNS:  Blood pressure (!) 158/61, pulse 67, temperature 98.7 F (37.1 C), temperature source Oral, resp. rate 18, height 5\' 5"  (1.651 m), weight 84.4 kg (186 lb), SpO2 97 %.  I/O:  No intake or output data in the 24 hours ending 12/28/15 1216  PHYSICAL EXAMINATION:  Physical Exam  GENERAL:  82 y.o.-year-old patient lying in the bed with no acute distress.  LUNGS: Normal breath sounds bilaterally, no wheezing, rales,rhonchi or crepitation. No use of accessory muscles of respiration.  CARDIOVASCULAR: S1, S2 normal. No murmurs, rubs, or gallops.  ABDOMEN: Soft, non-tender, non-distended. Bowel sounds present. No organomegaly or mass.  NEUROLOGIC: Moves all 4 extremities. PSYCHIATRIC: The patient is alert and oriented x 3.  SKIN: No obvious rash, lesion, or ulcer.   DATA REVIEW:   CBC  Recent Labs Lab 12/26/15 0556  WBC 6.5  HGB 13.3  HCT 38.4*  PLT 184    Chemistries   Recent Labs Lab 12/26/15 0556  NA 139  K 3.7  CL 105  CO2 25  GLUCOSE 112*  BUN 20  CREATININE 1.33*  CALCIUM 8.8*    Cardiac Enzymes  Recent Labs Lab 12/25/15 1210  TROPONINI <0.03    Microbiology Results  Results for orders placed or performed during the hospital encounter of 12/24/15  MRSA PCR Screening     Status: None   Collection Time: 12/25/15 12:30 AM  Result Value Ref Range Status   MRSA by PCR NEGATIVE NEGATIVE Final    Comment:        The GeneXpert MRSA Assay (FDA approved for NASAL specimens only), is one component of a comprehensive MRSA colonization surveillance program. It is not intended to diagnose MRSA infection nor to guide  or monitor treatment for MRSA infections.     RADIOLOGY:  No results found.  Follow up with PCP in 1 week.  Management plans discussed with the patient, family and they are in agreement.  CODE STATUS:  Code Status History    Date Active Date Inactive Code Status Order ID Comments User Context   12/25/2015 12:24 AM 12/25/2015 12:58 PM Full Code [id number]  Lance Coon, MD Inpatient      TOTAL TIME TAKING CARE OF THIS PATIENT ON DAY OF DISCHARGE: more than 30 minutes.   Hillary Bow R M.D on 12/28/2015 at 12:16 PM  Between 7am to 6pm - Pager - [phone]  After 6pm go to [email or web address] - password [email or web address] [facility]  [facility]  Office  [phone]  CC: Primary care physician; FITZGERALD, DAVID Mamie Nick, MD  Note: This dictation was prepared with Dragon dictation along with smaller phrase technology.  Any transcriptional errors that result from this process are unintentional.

## 2015-12-30 LAB — GLUCOSE, CAPILLARY: GLUCOSE-CAPILLARY: 121 mg/dL — AB (ref 65–99)

## 2016-01-22 ENCOUNTER — Ambulatory Visit (INDEPENDENT_AMBULATORY_CARE_PROVIDER_SITE_OTHER): Payer: Medicare Other | Admitting: Nurse Practitioner

## 2016-01-22 ENCOUNTER — Encounter: Payer: Self-pay | Admitting: Nurse Practitioner

## 2016-01-22 VITALS — BP 100/58 | HR 55 | Ht 65.0 in | Wt 192.0 lb

## 2016-01-22 DIAGNOSIS — N183 Chronic kidney disease, stage 3 unspecified: Secondary | ICD-10-CM

## 2016-01-22 DIAGNOSIS — I10 Essential (primary) hypertension: Secondary | ICD-10-CM | POA: Insufficient documentation

## 2016-01-22 DIAGNOSIS — E1122 Type 2 diabetes mellitus with diabetic chronic kidney disease: Secondary | ICD-10-CM | POA: Diagnosis not present

## 2016-01-22 DIAGNOSIS — R079 Chest pain, unspecified: Secondary | ICD-10-CM

## 2016-01-22 DIAGNOSIS — E119 Type 2 diabetes mellitus without complications: Secondary | ICD-10-CM | POA: Insufficient documentation

## 2016-01-22 MED ORDER — LISINOPRIL 20 MG PO TABS: 20.0000 mg | ORAL_TABLET | Freq: Every day | ORAL | 3 refills | Status: DC

## 2016-01-22 NOTE — Patient Instructions (Addendum)
Medication Instructions:  Your physician has recommended you make the following change in your medication:  DECREASE lisinopril to 20mg  once daily   Labwork: none  Testing/Procedures: Your physician has requested that you have a lexiscan myoview. For further information please visit [email or web address]. Please follow instruction sheet, as given.  [place]  Your caregiver has ordered a Stress Test with nuclear imaging. The purpose of this test is to evaluate the blood supply to your heart muscle. This procedure is referred to as a "Non-Invasive Stress Test." This is because other than having an IV started in your vein, nothing is inserted or "invades" your body. Cardiac stress tests are done to find areas of poor blood flow to the heart by determining the extent of coronary artery disease (CAD). Some patients exercise on a treadmill, which naturally increases the blood flow to your heart, while others who are  unable to walk on a treadmill due to physical limitations have a pharmacologic/chemical stress agent called Lexiscan . This medicine will mimic walking on a treadmill by temporarily increasing your coronary blood flow.   Please note: these test may take anywhere between 2-4 hours to complete  PLEASE REPORT TO [facility] AT THE FIRST DESK WILL DIRECT YOU WHERE TO GO  Date of Procedure:__Thursday, August 31_____  Arrival Time for Procedure:___8:15am___  Instructions regarding medication:   __xx__:  Hold atenolol night before procedure and morning of procedure    PLEASE NOTIFY THE OFFICE AT LEAST 24 HOURS IN ADVANCE IF YOU ARE UNABLE TO KEEP YOUR APPOINTMENT.  [phone] AND  PLEASE NOTIFY NUCLEAR MEDICINE AT [facility] AT LEAST 24 HOURS IN ADVANCE IF YOU ARE UNABLE TO KEEP YOUR APPOINTMENT. [phone]  How to prepare for your Myoview test:  1. Do not eat or drink after midnight 2. No caffeine for 24 hours prior to test 3. No smoking 24 hours  prior to test. 4. Your medication may be taken with water.  If your doctor stopped a medication because of this test, do not take that medication. 5. Ladies, please do not wear dresses.  Skirts or pants are appropriate. Please wear a short sleeve shirt. 6. No perfume, cologne or lotion. 7. Wear comfortable walking shoes. No heels!            Follow-Up: Your physician recommends that you schedule a follow-up appointment in: 3 months with Dr. Rockey Situ.    Any Other Special Instructions Will Be Listed Below (If Applicable).     If you need a refill on your cardiac medications before your next appointment, please call your pharmacy.  Cardiac Nuclear Scanning A cardiac nuclear scan is used to check your heart for problems, such as the following:  A portion of the heart is not getting enough blood.  Part of the heart muscle has died, which happens with a heart attack.  The heart wall is not working normally.  In this test, a radioactive dye (tracer) is injected into your bloodstream. After the tracer has traveled to your heart, a scanning device is used to measure how much of the tracer is absorbed by or distributed to various areas of your heart. LET [facility] CARE PROVIDER KNOW ABOUT:  Any allergies you have.  All medicines you are taking, including vitamins, herbs, eye drops, creams, and over-the-counter medicines.  Previous problems you or members of your family have had with the use of anesthetics.  Any blood disorders you have.  Previous surgeries you have had.  Medical  conditions you have.  RISKS AND COMPLICATIONS Generally, this is a safe procedure. However, as with any procedure, problems can occur. Possible problems include:   Serious chest pain.  Rapid heartbeat.  Sensation of warmth in your chest. This usually passes quickly. BEFORE THE PROCEDURE Ask your health care provider about changing or stopping your regular medicines. PROCEDURE This procedure  is usually done at a hospital and takes 2-4 hours.  An IV tube is inserted into one of your veins.  Your health care provider will inject a small amount of radioactive tracer through the tube.  You will then wait for 20-40 minutes while the tracer travels through your bloodstream.  You will lie down on an exam table so images of your heart can be taken. Images will be taken for about 15-20 minutes.  You will exercise on a treadmill or stationary bike. While you exercise, your heart activity will be monitored with an electrocardiogram (ECG), and your blood pressure will be checked.  If you are unable to exercise, you may be given a medicine to make your heart beat faster.  When blood flow to your heart has peaked, tracer will again be injected through the IV tube.  After 20-40 minutes, you will get back on the exam table and have more images taken of your heart.  When the procedure is over, your IV tube will be removed. AFTER THE PROCEDURE  You will likely be able to leave shortly after the test. Unless your health care provider tells you otherwise, you may return to your normal schedule, including diet, activities, and medicines.  Make sure you find out how and when you will get your test results.   This information is not intended to replace advice given to you by your health care provider. Make sure you discuss any questions you have with your health care provider.   Document Released: 06/10/2004 Document Revised: 05/21/2013 Document Reviewed: 04/24/2013 Elsevier Interactive Patient Education [employer].

## 2016-01-22 NOTE — Progress Notes (Signed)
Office Visit    Patient Name: WELTON COMERFORD Date of Encounter: 01/22/2016  Primary Care Provider:  FITZGERALD, DAVID Mamie Nick, MD Primary Cardiologist:  Johnny Bridge, MD   Chief Complaint    82 y/o ? with a h/o poorly controlled HTN, DM II, and CKD III, who presents for f/u after recent hospitalization for c/p and malignant HTN.  Past Medical History    Past Medical History:  Diagnosis Date  . Arthritis   . Chest pain    a. 11/2015 admitted w/ cp in setting of malignant HTN, r/o. Inf wma on echo.  . CKD (chronic kidney disease), stage III   . Diastolic dysfunction    a. 11/2015 Echo: EF 50-55%, gr1 DD, inf HK, mild AI, nl RV.  Marland Kitchen Essential hypertension   . Gout   . Prostate disease   . Type II diabetes mellitus ([place])    Past Surgical History:  Procedure Laterality Date  . ABDOMINAL SURGERY    . SMALL INTESTINE SURGERY      Allergies  No Known Allergies  History of Present Illness    82 y/o ? with a h/o poorly controlled HTN, DM II, and CKD III, who was recently admitted to [facility] at the end of July 2017, 2/2 chest pain and malignant HTN.  Systolic BP @ the time was 230 mmHg.  He r/o for MI and HTN was appropriately treated.  Echo showed nl EF with inferior HK and grade 1 diast dysfxn.  A lexiscan MV was scheduled but the pt initially refused, fearing that he would have to walk on the tmill.  He was later d/c'd with recommendation for outpt stress testing.  Since d/c last month, he has done reasonably well.  He checks his BP from time to time and lately it has been trending in the 90's to low 100's.  In that setting, he has had some lightheadedness.  He has not had any chest pain but does experience chronic DOE, which has been more pronounced over the past year.  He denies palpitations, pnd, orthopnea, n, v, dizziness, syncope, edema, weight gain, or early satiety.   Home Medications    Prior to Admission medications   Medication Sig Start Date End Date Taking? Authorizing  Provider  allopurinol (ZYLOPRIM) 300 MG tablet Take 300 mg by mouth daily. 10/15/15 10/14/16 Yes Historical Provider, MD  aspirin EC 81 MG EC tablet Take 1 tablet (81 mg total) by mouth daily. 12/26/15  Yes Srikar Sudini, MD  atenolol (TENORMIN) 50 MG tablet Take 0.5 tablets (25 mg total) by mouth daily. 12/26/15  Yes Srikar Sudini, MD  doxazosin (CARDURA) 4 MG tablet Take 4 mg by mouth daily. 09/15/15  Yes Historical Provider, MD  finasteride (PROSCAR) 5 MG tablet Take 5 mg by mouth daily. 09/15/15  Yes Historical Provider, MD  fluticasone (FLONASE) 50 MCG/ACT nasal spray Place 2 sprays into the nose daily as needed for allergies.  09/10/14  Yes Historical Provider, MD  isosorbide mononitrate (IMDUR) 30 MG 24 hr tablet Take 1 tablet (30 mg total) by mouth 2 (two) times daily. 12/26/15  Yes Srikar Sudini, MD  lisinopril (PRINIVIL,ZESTRIL) 40 MG tablet Take 40 mg by mouth daily. 08/27/15 08/26/16 Yes Historical Provider, MD  NIFEdipine (ADALAT CC) 90 MG 24 hr tablet Take 90 mg by mouth daily. 09/15/15  Yes Historical Provider, MD    Review of Systems    As above, he has some degree of chronic DOE.  He has also been  intermittently lightheaded in the setting of low blood pressures.  He denies chest pain, palpitations, pnd, orthopnea, n, v, dizziness, syncope, edema, weight gain, or early satiety.  All other systems reviewed and are otherwise negative except as noted above.  Physical Exam    VS:  BP (!) 100/58 (BP Location: Left Arm, Patient Position: Sitting, Cuff Size: Normal)   Pulse (!) 55   Ht 5\' 5"  (1.651 m)   Wt 192 lb (87.1 kg)   BMI 31.95 kg/m  , BMI Body mass index is 31.95 kg/m. GEN: Well nourished, well developed, in no acute distress.  HEENT: normal.  Neck: Supple, no JVD, carotid bruits, or masses. Cardiac: RRR, no murmurs, rubs, or gallops. No clubbing, cyanosis, edema.  Radials/DP/PT 2+ and equal bilaterally.  Respiratory:  Respirations regular and unlabored, clear to auscultation  bilaterally. GI: Soft, nontender, nondistended, BS + x 4. MS: no deformity or atrophy. Skin: warm and dry, no rash. Neuro:  Strength and sensation are intact. Psych: Normal affect.  Accessory Clinical Findings    ECG - sinus brady, 55, LVH, nonspecific t changes.  Assessment & Plan    1.  Chest Pain:  Pt was admitted to [facility] in late July 2017 with chest pain and malignant HTN.  He r/o for MI but echo showed an inferior wma.  Stress testing was recommended but he initially refused then later agreed to have it done as an oupt. He has had no further chest pain.  I will arrange for a lexiscan MV for next week.  Cont asa,  blocker, and nitrate therapy.  2.  Malignant HTN:  SBP 230 mmHg on admission in July 2017.  BP 100/58 today and he shows me some reading in the 90's recently.  He has noted intermittent lightheadedness.  In that setting, I will cut back on his lisinopril to 20 mg daily (currently @ 40 mg).  Cont  blocker, ccb, and nitrate.  He will continue to follow his bp's @ home.  3.  DM II:  This is listed in hx however A1c was 4.9 in July.  He is not on any therapy.  4.  CKD III:  Stable on d/c 7/29.  Cont acei but reduce to 20 daily in light of relative hypotn.  5.  HL:  Recent LDL 85.  Not currently on statin.  6.  Dispo:  F/u MV as planned.  F/u in 3 mos or sooner if necessary.  Pt to continue following bp's @ home.  Murray Hodgkins, NP 01/22/2016, 2:07 PM

## 2016-01-28 ENCOUNTER — Encounter
Admission: RE | Admit: 2016-01-28 | Discharge: 2016-01-28 | Disposition: A | Discharge status: Home / Self Care | Payer: Medicare Other | Source: Ambulatory Visit | Attending: Nurse Practitioner | Admitting: Nurse Practitioner

## 2016-01-28 DIAGNOSIS — R079 Chest pain, unspecified: Secondary | ICD-10-CM | POA: Insufficient documentation

## 2016-01-28 MED ORDER — TECHNETIUM TC 99M TETROFOSMIN IV KIT
32.5420 | PACK | Freq: Once | INTRAVENOUS | Status: AC | PRN
  Administered 2016-01-28: 32.542 via INTRAVENOUS

## 2016-01-28 MED ORDER — REGADENOSON 0.4 MG/5ML IV SOLN
0.4000 mg | Freq: Once | INTRAVENOUS | Status: AC
  Administered 2016-01-28: 0.4 mg via INTRAVENOUS

## 2016-01-28 MED ORDER — TECHNETIUM TC 99M TETROFOSMIN IV KIT
13.0000 | PACK | Freq: Once | INTRAVENOUS | Status: AC | PRN
  Administered 2016-01-28: 11.747 via INTRAVENOUS

## 2016-01-29 LAB — NM MYOCAR MULTI W/SPECT W/WALL MOTION / EF
CHL CUP RESTING HR STRESS: 55 {beats}/min
CHL CUP STRESS STAGE 1 GRADE: 0 %
CHL CUP STRESS STAGE 1 HR: 57 {beats}/min
CHL CUP STRESS STAGE 2 GRADE: 0 %
CHL CUP STRESS STAGE 2 SPEED: 0 mph
CHL CUP STRESS STAGE 3 GRADE: 0 %
CHL CUP STRESS STAGE 3 SPEED: 0 mph
CHL CUP STRESS STAGE 4 DBP: 49 mmHg
CHL CUP STRESS STAGE 4 HR: 60 {beats}/min
CHL CUP STRESS STAGE 4 SBP: 123 mmHg
CSEPHR: 50 %
CSEPPHR: 64 {beats}/min
CSEPPMHR: 46 %
Estimated workload: 1 METS
LVDIAVOL: 75 mL (ref 62–150)
LVSYSVOL: 26 mL
SDS: 0
SRS: 4
SSS: 0
Stage 1 Speed: 0 mph
Stage 2 HR: 57 {beats}/min
Stage 3 HR: 64 {beats}/min
Stage 4 Grade: 0 %
Stage 4 Speed: 0 mph
TID: 1.13

## 2016-04-25 ENCOUNTER — Encounter: Payer: Self-pay | Admitting: Cardiovascular Disease

## 2016-04-25 ENCOUNTER — Ambulatory Visit (INDEPENDENT_AMBULATORY_CARE_PROVIDER_SITE_OTHER): Payer: Medicare Other | Admitting: Cardiovascular Disease

## 2016-04-25 VITALS — BP 116/56 | HR 56 | Ht 65.5 in | Wt 194.8 lb

## 2016-04-25 DIAGNOSIS — N183 Chronic kidney disease, stage 3 unspecified: Secondary | ICD-10-CM

## 2016-04-25 DIAGNOSIS — I10 Essential (primary) hypertension: Secondary | ICD-10-CM

## 2016-04-25 DIAGNOSIS — I209 Angina pectoris, unspecified: Secondary | ICD-10-CM

## 2016-04-25 NOTE — Patient Instructions (Signed)

## 2016-04-25 NOTE — Progress Notes (Signed)
Cardiology Office Note  Date:  04/25/2016   ID:  Wesley Burton, DOB [birth year], MRN [id number]  PCP:  Leonel Ramsay, MD   Chief Complaint  Patient presents with  . other    3 month follow up. Meds reviewed by the pt. verbally. "doing well." Pt. needs a cardiac clearance for biopsy for prostate.    HPI:  82 y/o ? with a h/o poorly controlled HTN, and CKD III, Who presents for follow-up of his blood pressure and prior history of chest pain  admitted to [facility] at the end of July 2017, 2/2 chest pain and malignant HTN.  Systolic BP @ the time was 230 mmHg.  He r/o for MI and HTN was appropriately treated.    Echo showed nl EF with inferior HK and grade 1 diast dysfxn.     lexiscan MV as an outpt 01/28/2016 showing no ischemia  In follow-up today he reports that he is doing well with no complaints Denies any significant chest pain on exertion He is a nonsmoker, no diabetes, hemoglobin A1c 4.9 Denies any significant shortness of breath or chest discomfort on exertion Scheduled for prostate biopsy for elevated PSA  Other lab work reviewed PSA up to 5.3 Total chol 145, LDL 85 CR 1.33   denies palpitations, pnd, orthopnea, n, v, dizziness, syncope, edema, weight gain, or early satiety.   EKG on today's visit shows normal sinus rhythm with rate 59 bpm, nonspecific T wave abnormality V5, V6, 1 and aVL  Other past medical history reviewed On last clinic visit lisinopril decreased down to 20 mg daily from 40 mg daily Blood pressure continues to run low   PMH:   has a past medical history of Arthritis; Chest pain; CKD (chronic kidney disease), stage III; Diastolic dysfunction; Essential hypertension; Gout; Prostate disease; and Type II diabetes mellitus ([place]).  PSH:    Past Surgical History:  Procedure Laterality Date  . ABDOMINAL SURGERY    . SMALL INTESTINE SURGERY      Current Outpatient Prescriptions  Medication Sig Dispense Refill  . allopurinol (ZYLOPRIM) 300 MG  tablet Take 300 mg by mouth daily.    Marland Kitchen aspirin EC 81 MG EC tablet Take 1 tablet (81 mg total) by mouth daily.    Marland Kitchen atenolol (TENORMIN) 50 MG tablet Take 0.5 tablets (25 mg total) by mouth daily. 30 tablet 0  . doxazosin (CARDURA) 4 MG tablet Take 4 mg by mouth daily.    . finasteride (PROSCAR) 5 MG tablet Take 5 mg by mouth daily.    . fluticasone (FLONASE) 50 MCG/ACT nasal spray Place 2 sprays into the nose daily as needed for allergies.     Marland Kitchen lisinopril (PRINIVIL,ZESTRIL) 20 MG tablet Take 1 tablet (20 mg total) by mouth daily. 30 tablet 3  . NIFEdipine (ADALAT CC) 90 MG 24 hr tablet Take 90 mg by mouth daily.     No current facility-administered medications for this visit.      Allergies:   Patient has no known allergies.   Social History:  The patient  reports that he has never smoked. He has never used smokeless tobacco. He reports that he does not drink alcohol or use drugs.   Family History:   family history includes CAD in his father and mother; Diabetes in his father; Heart attack in his father and mother.    Review of Systems: Review of Systems  Constitutional: Negative.   Respiratory: Negative.   Cardiovascular: Negative.   Gastrointestinal: Negative.  Musculoskeletal: Negative.   Neurological: Negative.   Psychiatric/Behavioral: Negative.   All other systems reviewed and are negative.    PHYSICAL EXAM: VS:  BP (!) 116/56 (BP Location: Left Arm, Patient Position: Sitting, Cuff Size: Normal)   Pulse (!) 56   Ht 5' 5.5" (1.664 m)   Wt 194 lb 12 oz (88.3 kg)   BMI 31.92 kg/m  , BMI Body mass index is 31.92 kg/m. GEN: Well nourished, well developed, in no acute distress  HEENT: normal  Neck: no JVD, carotid bruits, or masses Cardiac: RRR; no murmurs, rubs, or gallops,no edema  Respiratory:  clear to auscultation bilaterally, normal work of breathing GI: soft, nontender, nondistended, + BS MS: no deformity or atrophy  Skin: warm and dry, no rash Neuro:   Strength and sensation are intact Psych: euthymic mood, full affect    Recent Labs: 12/26/2015: BUN 20; Creatinine, Ser 1.33; Hemoglobin 13.3; Platelets 184; Potassium 3.7; Sodium 139    Lipid Panel Lab Results  Component Value Date   CHOL 145 12/26/2015   HDL 45 12/26/2015   LDLCALC 85 12/26/2015   TRIG 77 12/26/2015      Wt Readings from Last 3 Encounters:  04/25/16 194 lb 12 oz (88.3 kg)  01/22/16 192 lb (87.1 kg)  12/25/15 186 lb (84.4 kg)       ASSESSMENT AND PLAN:  Essential hypertension - Plan: EKG 12-Lead Blood pressure is well controlled on today's visit. No changes made to the medications.  Angina pectoris ([place]) - Plan: EKG 12-Lead Denies any chest pain on today's visit. Recent negative stress test. No further testing  CKD (chronic kidney disease), stage III Baseline creatinine 1.3, recommended that he should avoid NSAIDs   Total encounter time more than 15 minutes  Greater than 50% was spent in counseling and coordination of care with the patient   Disposition:   F/U  12 months   Orders Placed This Encounter  Procedures  . EKG 12-Lead     Signed, Esmond Plants, M.D., Ph.D. 04/25/2016  [place], [place]

## 2016-09-14 DIAGNOSIS — G309 Alzheimer's disease, unspecified: Secondary | ICD-10-CM | POA: Insufficient documentation

## 2016-09-14 DIAGNOSIS — F0280 Dementia in other diseases classified elsewhere without behavioral disturbance: Secondary | ICD-10-CM | POA: Insufficient documentation

## 2016-12-14 DIAGNOSIS — E538 Deficiency of other specified B group vitamins: Secondary | ICD-10-CM | POA: Insufficient documentation

## 2017-07-11 ENCOUNTER — Encounter: Payer: Self-pay | Admitting: Urology

## 2017-07-11 ENCOUNTER — Ambulatory Visit: Payer: Medicare Other | Admitting: Urology

## 2017-07-11 VITALS — BP 157/73 | HR 55 | Ht 65.0 in | Wt 198.0 lb

## 2017-07-11 DIAGNOSIS — C61 Malignant neoplasm of prostate: Secondary | ICD-10-CM | POA: Insufficient documentation

## 2017-07-11 NOTE — Progress Notes (Signed)
07/11/2017 1:27 PM   Wesley Burton 03-07-1931 [id number]  Referring provider: Leonel Ramsay, MD [place], [place] [address]  Chief Complaint  Patient presents with  . Follow-up   Urologic problem list: 1. 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   Completed radiation therapy + 6 months hormonal therapy 2018.  HPI: 84 year old male presents for follow-up of prostate cancer.  He was seen by radiation oncology at [facility] on 06/26/2017 and his PSA was 0.54.  He has no bothersome lower urinary tract symptoms.  He is feeling much better since the effects of Lupron have subsided.  He has no complaints today.  He is voiding without problems.  He ran out of doxazosin 2 months ago and has not seen any worsening of his lower urinary tract symptoms.   PMH: Past Medical History:  Diagnosis Date  . Arthritis   . Chest pain    a. 11/2015 admitted w/ cp in setting of malignant HTN, r/o. Inf wma on echo.  . CKD (chronic kidney disease), stage III ([place])   . Diastolic dysfunction    a. 11/2015 Echo: EF 50-55%, gr1 DD, inf HK, mild AI, nl RV.  Marland Kitchen Essential hypertension   . Gout   . Prostate disease   . Type II diabetes mellitus ([place])     Surgical History: Past Surgical History:  Procedure Laterality Date  . ABDOMINAL SURGERY    . SMALL INTESTINE SURGERY      Home Medications:  Allergies as of 07/11/2017   No Known Allergies     Medication List        Accurate as of 07/11/17  1:27 PM. Always use your most recent med list.          allopurinol 300 MG tablet Commonly known as:  ZYLOPRIM Take 300 mg by mouth daily.   aspirin 81 MG EC tablet Take 1 tablet (81 mg total) by mouth daily.   atenolol 50 MG tablet Commonly known as:  TENORMIN Take 0.5 tablets (25 mg total) by mouth daily.   doxazosin 4 MG tablet Commonly  known as:  CARDURA Take 4 mg by mouth daily.   finasteride 5 MG tablet Commonly known as:  PROSCAR Take 5 mg by mouth daily.   fluticasone 50 MCG/ACT nasal spray Commonly known as:  FLONASE Place 2 sprays into the nose daily as needed for allergies.   hydrochlorothiazide 25 MG tablet Commonly known as:  HYDRODIURIL Take 25 mg by mouth.   lisinopril 20 MG tablet Commonly known as:  PRINIVIL,ZESTRIL Take 1 tablet (20 mg total) by mouth daily.   MULTI-VITAMINS Tabs Take by mouth.   NIFEdipine 90 MG 24 hr tablet Commonly known as:  ADALAT CC Take 90 mg by mouth daily.       Allergies: No Known Allergies  Family History: Family History  Problem Relation Age of Onset  . Heart attack Mother   . CAD Mother   . Diabetes Father   . Heart attack Father   . CAD Father     Social History:  reports that  has never smoked. he has never used smokeless tobacco. He reports that he does not drink alcohol or use drugs.  ROS: UROLOGY Frequent Urination?: No Hard to postpone urination?: No Burning/pain with urination?: No Get up at night to urinate?: No Leakage of urine?: No Urine stream starts and stops?:  No Trouble starting stream?: No Do you have to strain to urinate?: No Blood in urine?: No Urinary tract infection?: No Sexually transmitted disease?: No Injury to kidneys or bladder?: No Painful intercourse?: No Weak stream?: No Erection problems?: No Penile pain?: No  Gastrointestinal Nausea?: No Vomiting?: No Indigestion/heartburn?: No Diarrhea?: No Constipation?: No  Constitutional Fever: No Night sweats?: No Weight loss?: No Fatigue?: No  Skin Skin rash/lesions?: No Itching?: No  Eyes Blurred vision?: No Double vision?: No  Ears/Nose/Throat Sore throat?: No Sinus problems?: No  Hematologic/Lymphatic Swollen glands?: No Easy bruising?: No  Cardiovascular Leg swelling?: No Chest pain?: No  Respiratory Cough?: Yes Shortness of breath?:  Yes  Endocrine Excessive thirst?: No  Musculoskeletal Back pain?: Yes Joint pain?: Yes  Neurological Headaches?: No Dizziness?: No  Psychologic Depression?: No Anxiety?: No  Physical Exam: BP (!) 157/73   Pulse (!) 55   Ht 5\' 5"  (1.651 m)   Wt 198 lb (89.8 kg)   BMI 32.95 kg/m   Constitutional:  Alert and oriented, No acute distress. HEENT: [place] AT, moist mucus membranes.  Trachea midline, no masses. Cardiovascular: No clubbing, cyanosis, or edema. Respiratory: Normal respiratory effort, no increased work of breathing. GI: Abdomen is soft, nontender, nondistended, no abdominal masses GU: No CVA tenderness.  Skin: No rashes, bruises or suspicious lesions. Lymph: No cervical or inguinal adenopathy. Neurologic: Grossly intact, no focal deficits, moving all 4 extremities. Psychiatric: Normal mood and affect.  Laboratory Data: Lab Results  Component Value Date   WBC 6.5 12/26/2015   HGB 13.3 12/26/2015   HCT 38.4 (L) 12/26/2015   MCV 98.7 12/26/2015   PLT 184 12/26/2015    Lab Results  Component Value Date   CREATININE 1.33 (H) 12/26/2015    Lab Results  Component Value Date   HGBA1C 4.9 12/25/2015    Assessment & Plan:   84 year old male with high-intermediate risk prostate cancer T1c status post RT. currently doing well.  He has a follow-up in radiation oncology in May 2019. Will wait and see when his next follow-up in radiation oncology will be prior to making a follow-up here.  He may discontinue finasteride and doxazosin.   Abbie Sons, [place] [address] [place], [place] [address] [phone]

## 2017-07-21 IMAGING — CR DG CHEST 2V
2 series · 2 of 2 positions shown · non-contrast
Comparison: 08/29/2005

CLINICAL DATA: PT claims that he has had chest pain sense
yesterday. Pt also states that his pain majority on the left side of
the chest.

EXAM:
CHEST  2 VIEW

[chest pa]
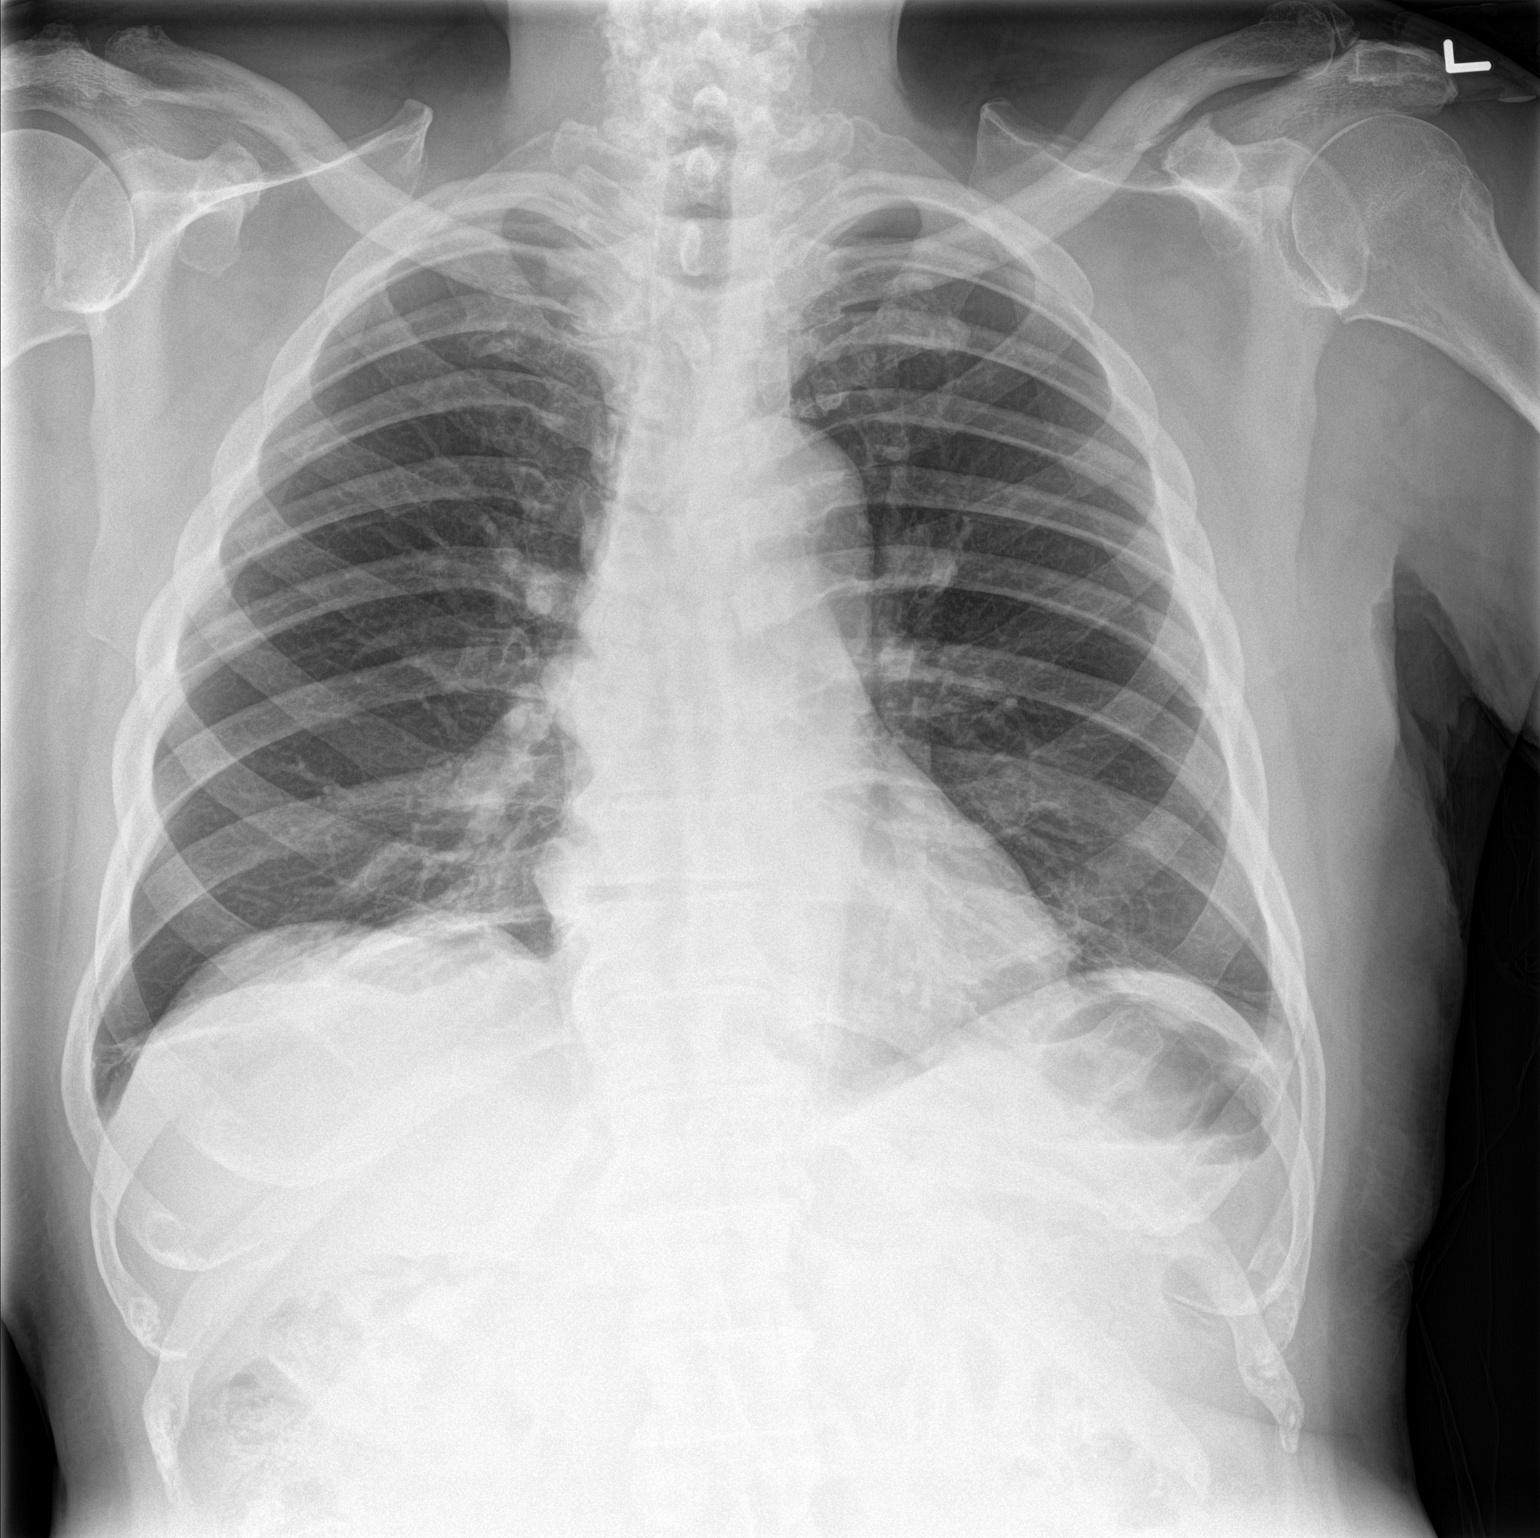

[chest lat]
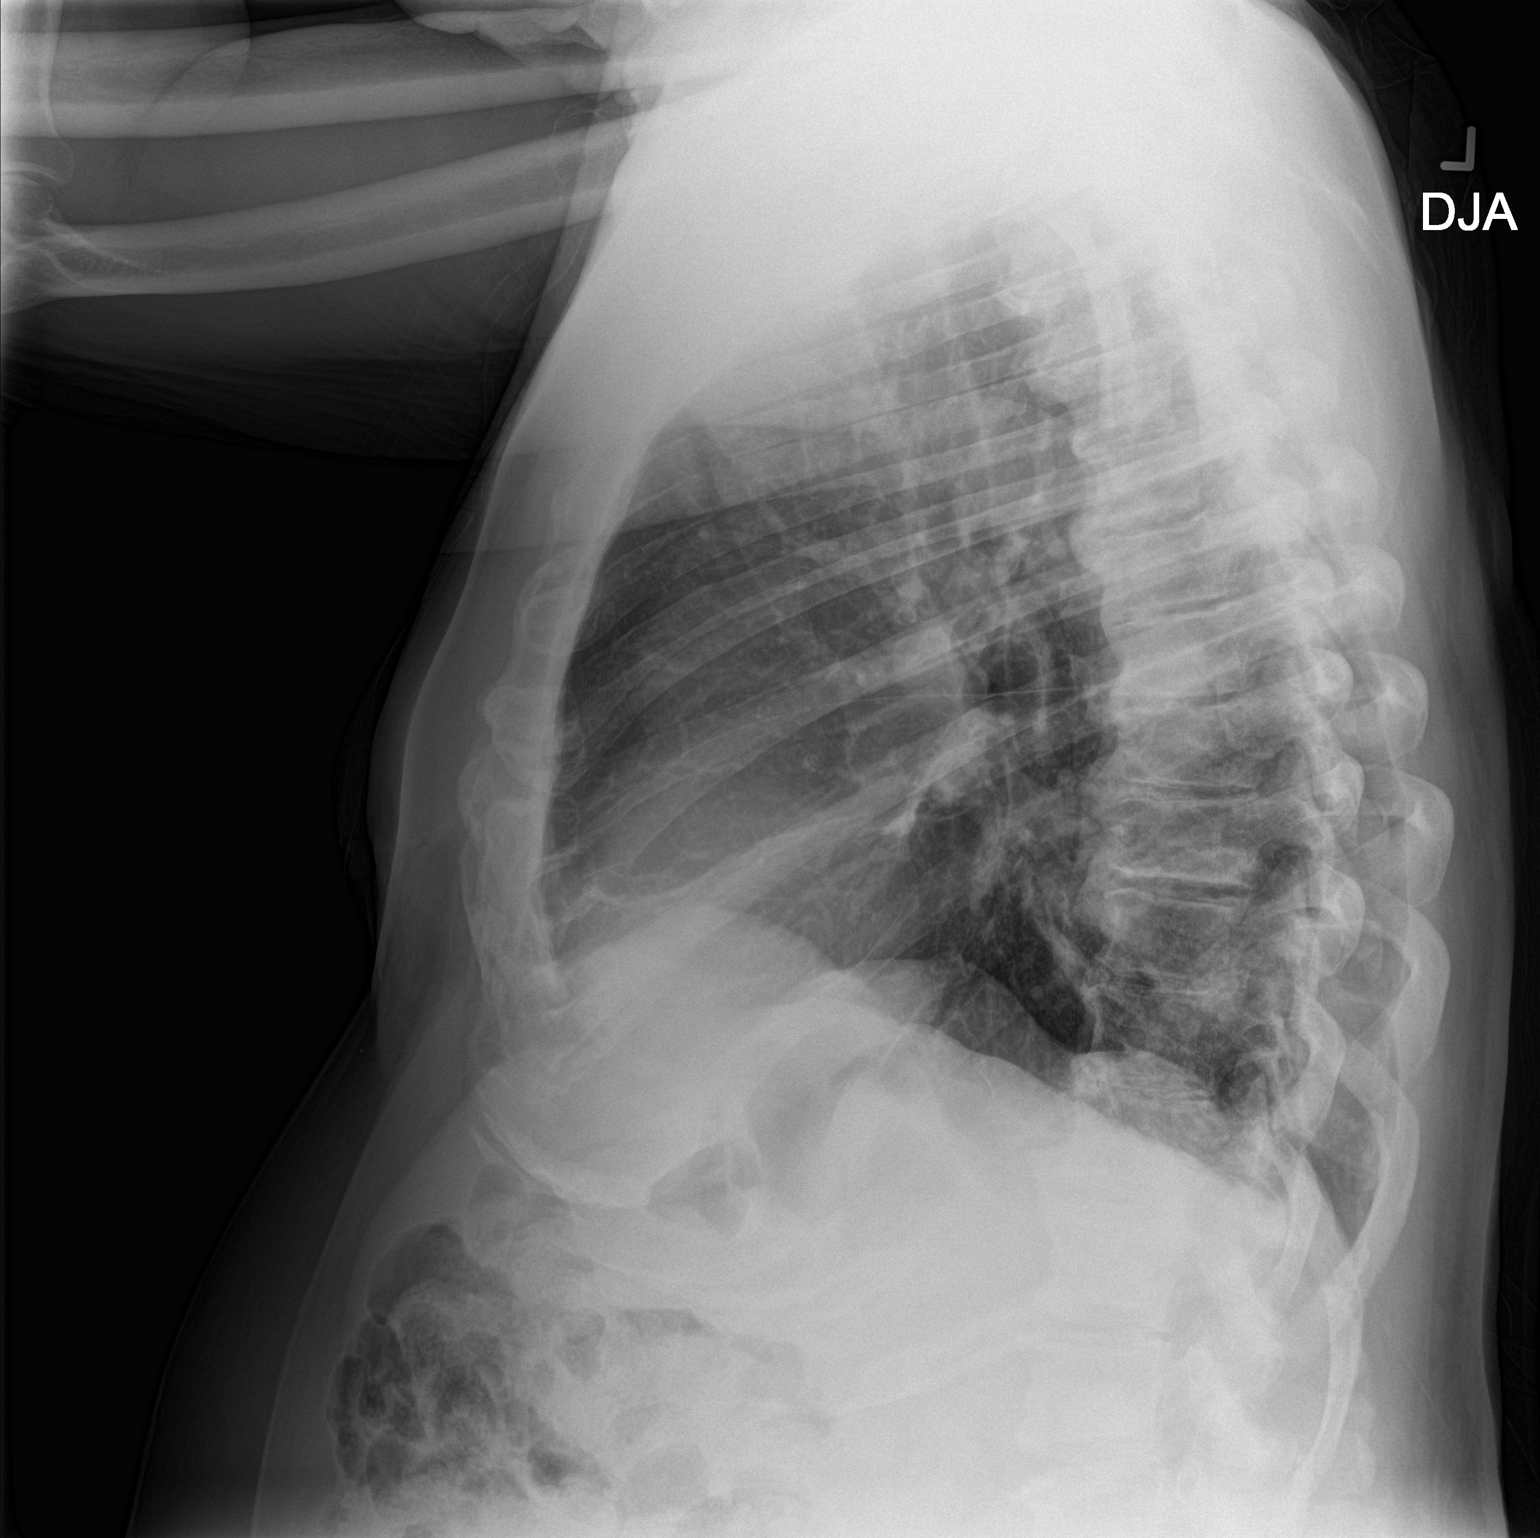

[2 of 2 positions shown; findings below may reference images not displayed]

FINDINGS: Heart, mediastinum and hila are unremarkable.

There is linear opacity in both lung bases consistent with scarring,
atelectasis or a combination. Lungs otherwise clear. No pleural
effusion or pneumothorax.

Bony thorax is intact.
IMPRESSION: No acute cardiopulmonary disease.

## 2017-10-12 ENCOUNTER — Telehealth: Payer: Self-pay | Admitting: Urology

## 2017-10-12 ENCOUNTER — Other Ambulatory Visit: Payer: Self-pay | Admitting: Urology

## 2017-10-12 DIAGNOSIS — C61 Malignant neoplasm of prostate: Secondary | ICD-10-CM

## 2017-10-12 NOTE — Progress Notes (Unsigned)
Mr. Mom needs a lab visit/PSA this month and follow-up visit with me in August 2019

## 2017-10-12 NOTE — Telephone Encounter (Signed)
Called patient had to leave a message for him to call back.  Wesley Burton

## 2017-10-26 ENCOUNTER — Other Ambulatory Visit: Payer: Medicare Other

## 2017-10-26 DIAGNOSIS — C61 Malignant neoplasm of prostate: Secondary | ICD-10-CM

## 2017-10-27 ENCOUNTER — Telehealth: Payer: Self-pay | Admitting: Family Medicine

## 2017-10-27 ENCOUNTER — Encounter: Payer: Self-pay | Admitting: Family Medicine

## 2017-10-27 LAB — PSA: Prostate Specific Ag, Serum: 0.3 ng/mL (ref 0.0–4.0)

## 2017-10-27 NOTE — Telephone Encounter (Signed)
Letter sent.

## 2017-10-27 NOTE — Telephone Encounter (Signed)
-----   Message from Abbie Sons, MD sent at 10/27/2017  7:24 AM EDT ----- PSA looks good at 0.3.  Keep August follow-up.

## 2018-01-17 ENCOUNTER — Ambulatory Visit: Payer: Medicare Other | Admitting: Urology

## 2018-01-17 ENCOUNTER — Encounter: Payer: Self-pay | Admitting: Urology

## 2018-01-17 VITALS — BP 120/57 | HR 64 | Ht 65.0 in | Wt 191.0 lb

## 2018-01-17 DIAGNOSIS — C61 Malignant neoplasm of prostate: Secondary | ICD-10-CM

## 2018-01-17 NOTE — Progress Notes (Signed)
01/17/2018 10:02 AM   Wesley Burton 03-30-1931 [id number]  Referring provider: Leonel Ramsay, MD [place], [place] [address]  Chief Complaint  Patient presents with  . Prostate Cancer   Urologic problem list: 1. 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   Completed radiation therapy + 6 months hormonal therapy 2018.  HPI: 84 year old male presents for follow-up of prostate cancer.  He inadvertently had his PSA drawn in May 2019 which was stable at 0.3.  He has no bothersome lower urinary tract symptoms.  He remains on finasteride and doxazosin.  Denies dysuria or gross hematuria.  Denies flank, abdominal, pelvic or scrotal pain.   PMH: Past Medical History:  Diagnosis Date  . Arthritis   . Chest pain    a. 11/2015 admitted w/ cp in setting of malignant HTN, r/o. Inf wma on echo.  . CKD (chronic kidney disease), stage III ([place])   . Diastolic dysfunction    a. 11/2015 Echo: EF 50-55%, gr1 DD, inf HK, mild AI, nl RV.  Marland Kitchen Essential hypertension   . Gout   . Prostate disease   . Type II diabetes mellitus ([place])     Surgical History: Past Surgical History:  Procedure Laterality Date  . ABDOMINAL SURGERY    . SMALL INTESTINE SURGERY      Home Medications:  Allergies as of 01/17/2018   No Known Allergies     Medication List        Accurate as of 01/17/18 10:02 AM. Always use your most recent med list.          allopurinol 300 MG tablet Commonly known as:  ZYLOPRIM Take 300 mg by mouth daily.   aspirin 81 MG EC tablet Take 1 tablet (81 mg total) by mouth daily.   atenolol 50 MG tablet Commonly known as:  TENORMIN Take 0.5 tablets (25 mg total) by mouth daily.   donepezil 5 MG tablet Commonly known as:  ARICEPT Take by mouth.   doxazosin 4 MG tablet Commonly known as:  CARDURA TAKE 1 TABLET (4 MG  TOTAL) BY MOUTH ONCE DAILY   finasteride 5 MG tablet Commonly known as:  PROSCAR TAKE 1 TABLET BY MOUTH EVERY DAY   fluticasone 50 MCG/ACT nasal spray Commonly known as:  FLONASE Place 2 sprays into the nose daily as needed for allergies.   hydrochlorothiazide 25 MG tablet Commonly known as:  HYDRODIURIL Take 25 mg by mouth.   lisinopril 20 MG tablet Commonly known as:  PRINIVIL,ZESTRIL Take 1 tablet (20 mg total) by mouth daily.   lisinopril 40 MG tablet Commonly known as:  PRINIVIL,ZESTRIL Take 40 mg by mouth daily.   loratadine 10 MG tablet Commonly known as:  CLARITIN Take by mouth.   MULTI-VITAMINS Tabs Take by mouth.   NIFEdipine 90 MG 24 hr tablet Commonly known as:  ADALAT CC Take 90 mg by mouth daily.   traMADol 50 MG tablet Commonly known as:  ULTRAM Take by mouth.       Allergies: No Known Allergies  Family History: Family History  Problem Relation Age of Onset  . Heart attack Mother   . CAD Mother   . Diabetes Father   . Heart attack Father   . CAD Father     Social History:  reports that he has never smoked. He has never used smokeless tobacco. He reports that he  does not drink alcohol or use drugs.  ROS: UROLOGY Frequent Urination?: No Hard to postpone urination?: No Burning/pain with urination?: No Get up at night to urinate?: No Leakage of urine?: No Urine stream starts and stops?: No Trouble starting stream?: No Do you have to strain to urinate?: No Blood in urine?: No Urinary tract infection?: No Sexually transmitted disease?: No Injury to kidneys or bladder?: No Painful intercourse?: No Weak stream?: No Erection problems?: No Penile pain?: No  Gastrointestinal Nausea?: No Vomiting?: No Indigestion/heartburn?: No Diarrhea?: No Constipation?: No  Constitutional Fever: No Night sweats?: No Weight loss?: No Fatigue?: No  Skin Skin rash/lesions?: No Itching?: No  Eyes Blurred vision?: No Double vision?:  No  Ears/Nose/Throat Sore throat?: No Sinus problems?: No  Hematologic/Lymphatic Swollen glands?: No Easy bruising?: No  Cardiovascular Leg swelling?: No Chest pain?: No  Respiratory Cough?: No Shortness of breath?: No  Endocrine Excessive thirst?: No  Musculoskeletal Back pain?: No Joint pain?: No  Neurological Headaches?: No Dizziness?: No  Psychologic Depression?: No Anxiety?: No  Physical Exam: BP (!) 120/57 (BP Location: Left Arm, Patient Position: Sitting, Cuff Size: Large)   Pulse 64   Ht 5\' 5"  (4.765 m)   Wt 191 lb (86.6 kg)   BMI 31.78 kg/m   Constitutional:  Alert and oriented, No acute distress. HEENT: [place] AT, moist mucus membranes.  Trachea midline, no masses. Cardiovascular: No clubbing, cyanosis, or edema. Respiratory: Normal respiratory effort, no increased work of breathing. GI: Abdomen is soft, nontender, nondistended, no abdominal masses GU: No CVA tenderness Lymph: No cervical or inguinal lymphadenopathy. Skin: No rashes, bruises or suspicious lesions. Neurologic: Grossly intact, no focal deficits, moving all 4 extremities. Psychiatric: Normal mood and affect.   Assessment & Plan:    1. Prostate cancer [facility]) Doing well status post radiation therapy plus ADT for high-intermediate risk prostate cancer.  PSA was drawn today and if stable he will follow-up in 6 months.    Abbie Sons, [place] [address] [place], [place] [address] [phone]

## 2018-01-18 LAB — PSA: PROSTATE SPECIFIC AG, SERUM: 0.8 ng/mL (ref 0.0–4.0)

## 2018-01-22 ENCOUNTER — Telehealth: Payer: Self-pay | Admitting: Family Medicine

## 2018-01-22 NOTE — Telephone Encounter (Signed)
-----   Message from Abbie Sons, MD sent at 01/20/2018  9:35 PM EDT ----- PSA was 0.8- follow up 6 months

## 2018-01-22 NOTE — Telephone Encounter (Signed)
Patient notified

## 2018-09-19 ENCOUNTER — Telehealth: Payer: Self-pay

## 2018-09-19 NOTE — Telephone Encounter (Signed)
Called patient from recall list.  No answer. LMOV.

## 2018-10-03 NOTE — Telephone Encounter (Signed)
Called patient from recall.  No answer.  Number is disconnected. This is the 2nd attempt per recall list.

## 2018-10-11 NOTE — Telephone Encounter (Signed)
Called patient from recall list.  No answer. LMOV.  This is the 3rd attempt per recall list.  Will delete recall.   

## 2019-01-18 ENCOUNTER — Ambulatory Visit (INDEPENDENT_AMBULATORY_CARE_PROVIDER_SITE_OTHER): Payer: Medicare Other | Admitting: Urology

## 2019-01-18 ENCOUNTER — Other Ambulatory Visit: Payer: Self-pay

## 2019-01-18 ENCOUNTER — Encounter: Payer: Self-pay | Admitting: Urology

## 2019-01-18 VITALS — BP 152/69 | HR 48 | Ht 65.0 in | Wt 191.0 lb

## 2019-01-18 DIAGNOSIS — C61 Malignant neoplasm of prostate: Secondary | ICD-10-CM | POA: Diagnosis not present

## 2019-01-18 NOTE — Progress Notes (Signed)
01/18/2019 2:17 PM   Wesley Burton 06-29-1930 [id number]  Referring provider: Leonel Ramsay, MD [address] [place],  [place] [address]  Chief Complaint  Patient presents with  . Prostate Cancer    Urologic history: 1. cT1c adenocarcinoma prostate  -Unfavorable intermediate risk  -Corrected PSA 10.64  -Fusion biopsy 04/2016 [facility]  -PI-RADS 4 lesion right mid prostate  -45 g prostate  -Gleason 4+3=7 involving 5/12 sampling cores plus Gleason 3+4 in MRI-targeted lesion.  -Tx IMRT + ADT x6 months   HPI: 85 y.o. male presents for follow-up of prostate cancer.  I last saw him in August 2018 and a six-month follow-up was recommended which apparently did not get scheduled.  Overall he states he is doing well.  He has a stable voiding pattern.  He remains on finasteride and doxazosin.  Denies dysuria or gross hematuria.  Denies flank, abdominal or pelvic pain.  Last PSA was August 2019 and was 0.8.   PMH: Past Medical History:  Diagnosis Date  . Arthritis   . Chest pain    a. 11/2015 admitted w/ cp in setting of malignant HTN, r/o. Inf wma on echo.  . CKD (chronic kidney disease), stage III ([place])   . Diastolic dysfunction    a. 11/2015 Echo: EF 50-55%, gr1 DD, inf HK, mild AI, nl RV.  Marland Kitchen Essential hypertension   . Gout   . Prostate disease   . Type II diabetes mellitus ([place])     Surgical History: Past Surgical History:  Procedure Laterality Date  . ABDOMINAL SURGERY    . SMALL INTESTINE SURGERY      Home Medications:  Allergies as of 01/18/2019   No Known Allergies     Medication List       Accurate as of January 18, 2019  2:17 PM. If you have any questions, ask your nurse or doctor.        allopurinol 300 MG tablet Commonly known as: ZYLOPRIM Take 300 mg by mouth daily.   aspirin 81 MG EC tablet Take 1 tablet (81 mg total) by mouth daily.   atenolol 50 MG tablet Commonly known as: TENORMIN Take 0.5 tablets (25 mg total) by mouth daily.    donepezil 5 MG tablet Commonly known as: ARICEPT Take by mouth.   doxazosin 4 MG tablet Commonly known as: CARDURA TAKE 1 TABLET (4 MG TOTAL) BY MOUTH ONCE DAILY   finasteride 5 MG tablet Commonly known as: PROSCAR TAKE 1 TABLET BY MOUTH EVERY DAY   fluticasone 50 MCG/ACT nasal spray Commonly known as: FLONASE Place 2 sprays into the nose daily as needed for allergies.   hydrochlorothiazide 25 MG tablet Commonly known as: HYDRODIURIL Take 25 mg by mouth.   lisinopril 40 MG tablet Commonly known as: ZESTRIL Take 40 mg by mouth daily.   loratadine 10 MG tablet Commonly known as: CLARITIN Take by mouth.   memantine 5 MG tablet Commonly known as: NAMENDA Take 5 mg by mouth 2 (two) times daily.   Multi-Vitamins Tabs Take by mouth.   NIFEdipine 90 MG 24 hr tablet Commonly known as: PROCARDIA XL/NIFEDICAL-XL Take 90 mg by mouth daily.   traMADol 50 MG tablet Commonly known as: ULTRAM Take by mouth.       Allergies: No Known Allergies  Family History: Family History  Problem Relation Age of Onset  . Heart attack Mother   . CAD Mother   . Diabetes Father   . Heart attack Father   .  CAD Father     Social History:  reports that he has never smoked. He has never used smokeless tobacco. He reports that he does not drink alcohol or use drugs.  ROS: UROLOGY Frequent Urination?: No Hard to postpone urination?: No Burning/pain with urination?: No Get up at night to urinate?: No Leakage of urine?: No Urine stream starts and stops?: No Trouble starting stream?: No Do you have to strain to urinate?: No Blood in urine?: No Urinary tract infection?: No Sexually transmitted disease?: No Injury to kidneys or bladder?: No Painful intercourse?: No Weak stream?: No Erection problems?: No Penile pain?: No  Gastrointestinal Nausea?: No Vomiting?: No Indigestion/heartburn?: No Diarrhea?: No Constipation?: No  Constitutional Fever: No Night sweats?: No  Weight loss?: No Fatigue?: No  Skin Skin rash/lesions?: No Itching?: No  Eyes Blurred vision?: No Double vision?: No  Ears/Nose/Throat Sore throat?: No Sinus problems?: No  Hematologic/Lymphatic Swollen glands?: No Easy bruising?: No  Cardiovascular Leg swelling?: No Chest pain?: No  Respiratory Cough?: No Shortness of breath?: No  Endocrine Excessive thirst?: No  Musculoskeletal Back pain?: No Joint pain?: No  Neurological Headaches?: No Dizziness?: No  Psychologic Depression?: No Anxiety?: No  Physical Exam: BP (!) 152/69 (BP Location: Left Arm, Patient Position: Sitting, Cuff Size: Normal)   Pulse (!) 48   Ht 5\' 5"  (1.651 m)   Wt 191 lb (86.6 kg)   BMI 31.78 kg/m   Constitutional:  Alert, No acute distress. HEENT: [place] AT, moist mucus membranes.  Trachea midline, no masses. Cardiovascular: No clubbing, cyanosis, or edema. Respiratory: Normal respiratory effort, no increased work of breathing. GI: Abdomen is soft, nontender, nondistended, no abdominal masses GU: No CVA tenderness Skin: No rashes, bruises or suspicious lesions. Neurologic: Grossly intact, no focal deficits, moving all 4 extremities. Psychiatric: Normal mood and affect.   Assessment & Plan:    - T1c adenocarcinoma prostate; unfavorable intermediate risk Status post radiation therapy plus ADT.  PSA was drawn today and would recommend a 71-month follow-up PSA in 1 year PSA/office visit.   Abbie Sons, [facility] [address] [place], [place] [address] [phone]

## 2019-01-19 LAB — PSA: Prostate Specific Ag, Serum: 0.6 ng/mL (ref 0.0–4.0)

## 2019-01-21 ENCOUNTER — Telehealth: Payer: Self-pay

## 2019-01-21 NOTE — Telephone Encounter (Signed)
-----   Message from Abbie Sons, MD sent at 01/20/2019  9:36 AM EDT ----- PSA stable 0.6

## 2019-01-21 NOTE — Telephone Encounter (Signed)
Called pt informed him of the information below. Pt gave verbal understanding.  

## 2019-03-14 ENCOUNTER — Encounter: Payer: Self-pay | Admitting: Urology

## 2019-03-14 ENCOUNTER — Ambulatory Visit: Payer: Medicare Other | Admitting: Urology

## 2019-03-14 ENCOUNTER — Other Ambulatory Visit: Payer: Self-pay

## 2019-03-14 VITALS — BP 191/64 | HR 48 | Ht 65.0 in | Wt 191.0 lb

## 2019-03-14 DIAGNOSIS — R399 Unspecified symptoms and signs involving the genitourinary system: Secondary | ICD-10-CM | POA: Diagnosis not present

## 2019-03-14 DIAGNOSIS — C61 Malignant neoplasm of prostate: Secondary | ICD-10-CM | POA: Diagnosis not present

## 2019-03-14 LAB — MICROSCOPIC EXAMINATION
Bacteria, UA: NONE SEEN
RBC: NONE SEEN /hpf (ref 0–2)

## 2019-03-14 LAB — URINALYSIS, COMPLETE
Bilirubin, UA: NEGATIVE
Glucose, UA: NEGATIVE
Ketones, UA: NEGATIVE
Leukocytes,UA: NEGATIVE
Nitrite, UA: NEGATIVE
RBC, UA: NEGATIVE
Specific Gravity, UA: 1.02 (ref 1.005–1.030)
Urobilinogen, Ur: 0.2 mg/dL (ref 0.2–1.0)
pH, UA: 5.5 (ref 5.0–7.5)

## 2019-03-14 LAB — BLADDER SCAN AMB NON-IMAGING

## 2019-03-14 NOTE — Progress Notes (Signed)
03/14/2019 11:23 AM   Wesley Burton [birth year] [id number]  Referring provider: Leonel Ramsay, MD [place],  [place] [address]  Chief Complaint  Patient presents with  . Urinary Incontinence    Urologic history: 1. cT1c adenocarcinoma prostate             -Unfavorable intermediate risk             -Corrected PSA 10.64             -Fusion biopsy 04/2016 [facility]             -PI-RADS 4 lesion right mid prostate             -45 g prostate             -Gleason 4+3; 5/12 sampling cores    -Gleason 3+4 in MRI-targeted lesion.             -Tx IMRT + ADT x6 months  HPI: 85 y.o. male with a history of prostate cancer seen for routine follow-up August 2020.  He presents today complaining of worsening lower urinary tract symptoms primarily frequency, urgency with urge incontinence.  He remains on doxazosin and finasteride.  Denies dysuria or gross hematuria.   PMH: Past Medical History:  Diagnosis Date  . Arthritis   . Chest pain    a. 11/2015 admitted w/ cp in setting of malignant HTN, r/o. Inf wma on echo.  . CKD (chronic kidney disease), stage III   . Diastolic dysfunction    a. 11/2015 Echo: EF 50-55%, gr1 DD, inf HK, mild AI, nl RV.  Marland Kitchen Essential hypertension   . Gout   . Prostate disease   . Type II diabetes mellitus ([place])     Surgical History: Past Surgical History:  Procedure Laterality Date  . ABDOMINAL SURGERY    . SMALL INTESTINE SURGERY      Home Medications:  Allergies as of 03/14/2019   No Known Allergies     Medication List       Accurate as of March 14, 2019 11:23 AM. If you have any questions, ask your nurse or doctor.        allopurinol 300 MG tablet Commonly known as: ZYLOPRIM Take 300 mg by mouth daily.   aspirin 81 MG EC tablet Take 1 tablet (81 mg total) by mouth daily.   atenolol 50 MG tablet Commonly known as: TENORMIN Take 0.5 tablets (25 mg total) by mouth daily.   donepezil 5 MG tablet Commonly known as:  ARICEPT Take by mouth.   doxazosin 4 MG tablet Commonly known as: CARDURA TAKE 1 TABLET (4 MG TOTAL) BY MOUTH ONCE DAILY   finasteride 5 MG tablet Commonly known as: PROSCAR TAKE 1 TABLET BY MOUTH EVERY DAY   fluticasone 50 MCG/ACT nasal spray Commonly known as: FLONASE Place 2 sprays into the nose daily as needed for allergies.   hydrochlorothiazide 25 MG tablet Commonly known as: HYDRODIURIL Take 25 mg by mouth.   lisinopril 40 MG tablet Commonly known as: ZESTRIL Take 40 mg by mouth daily.   loratadine 10 MG tablet Commonly known as: CLARITIN Take by mouth.   memantine 5 MG tablet Commonly known as: NAMENDA Take 5 mg by mouth 2 (two) times daily.   Multi-Vitamins Tabs Take by mouth.   NIFEdipine 90 MG 24 hr tablet Commonly known as: PROCARDIA XL/NIFEDICAL-XL Take 90 mg by mouth daily.   traMADol 50 MG tablet Commonly known as: ULTRAM Take  by mouth.       Allergies: No Known Allergies  Family History: Family History  Problem Relation Age of Onset  . Heart attack Mother   . CAD Mother   . Diabetes Father   . Heart attack Father   . CAD Father     Social History:  reports that he has never smoked. He has never used smokeless tobacco. He reports that he does not drink alcohol or use drugs.  ROS: UROLOGY Frequent Urination?: No Hard to postpone urination?: No Burning/pain with urination?: No Get up at night to urinate?: No Leakage of urine?: No Urine stream starts and stops?: No Trouble starting stream?: No Do you have to strain to urinate?: No Blood in urine?: No Urinary tract infection?: No Sexually transmitted disease?: No Injury to kidneys or bladder?: No Painful intercourse?: No Weak stream?: No Erection problems?: No Penile pain?: No  Gastrointestinal Nausea?: No Vomiting?: No Indigestion/heartburn?: No Diarrhea?: No Constipation?: No  Constitutional Fever: No Night sweats?: No Weight loss?: No Fatigue?: No  Skin Skin  rash/lesions?: No Itching?: No  Eyes Blurred vision?: No Double vision?: No  Ears/Nose/Throat Sore throat?: No Sinus problems?: No  Hematologic/Lymphatic Swollen glands?: No Easy bruising?: No  Cardiovascular Leg swelling?: No Chest pain?: No  Respiratory Cough?: No Shortness of breath?: No  Endocrine Excessive thirst?: No  Musculoskeletal Back pain?: Yes Joint pain?: Yes  Neurological Headaches?: No Dizziness?: No  Psychologic Depression?: No Anxiety?: No  Physical Exam: BP (!) 191/64 (BP Location: Left Arm, Patient Position: Sitting, Cuff Size: Normal)   Pulse (!) 48   Ht 5\' 5"  (1.651 m)   Wt 191 lb (86.6 kg)   BMI 31.78 kg/m   Constitutional:  Alert and oriented, No acute distress. HEENT: [place] AT, moist mucus membranes.  Trachea midline, no masses. Cardiovascular: No clubbing, cyanosis, or edema. Respiratory: Normal respiratory effort, no increased work of breathing. GI: Abdomen is soft, nontender, nondistended, no abdominal masses GU: No CVA tenderness Lymph: No cervical or inguinal lymphadenopathy. Skin: No rashes, bruises or suspicious lesions. Neurologic: Grossly intact, no focal deficits, moving all 4 extremities. Psychiatric: Normal mood and affect.  Laboratory Data:  Urinalysis Dipstick/microscopy negative  Assessment & Plan:    - Lower urinary tract symptoms Storage related voiding symptoms.  He initially did not feel he could give a urine specimen and estimated volume by bladder scan was 145 mL.  He subsequently voided and urinalysis was unremarkable.  Will give a trial of Myrbetriq 25 mg daily.  He was given 4 weeks worth of samples and will call back regarding efficacy.    - Prostate cancer Lab visit for PSA February 2021 and he is scheduled for office follow-up August 2021   Abbie Sons, [place] [address], [place], [place] [address] [phone]

## 2019-03-15 ENCOUNTER — Telehealth: Payer: Self-pay

## 2019-03-15 NOTE — Telephone Encounter (Signed)
-----   Message from Abbie Sons, MD sent at 03/15/2019  1:32 PM EDT ----- Urinalysis showed no evidence of infection

## 2019-03-15 NOTE — Telephone Encounter (Signed)
Called pt no answer. LM per DPR. Advised to call back for questions or concerns.

## 2019-03-17 ENCOUNTER — Encounter: Payer: Self-pay | Admitting: Urology

## 2019-03-17 DIAGNOSIS — R399 Unspecified symptoms and signs involving the genitourinary system: Secondary | ICD-10-CM | POA: Insufficient documentation

## 2019-07-01 ENCOUNTER — Other Ambulatory Visit: Payer: Self-pay

## 2019-07-01 DIAGNOSIS — C61 Malignant neoplasm of prostate: Secondary | ICD-10-CM

## 2019-07-02 ENCOUNTER — Other Ambulatory Visit: Payer: Medicare Other

## 2019-07-02 ENCOUNTER — Encounter: Payer: Self-pay | Admitting: Urology

## 2020-01-14 ENCOUNTER — Other Ambulatory Visit: Payer: Medicare Other

## 2020-01-14 ENCOUNTER — Other Ambulatory Visit: Payer: Self-pay

## 2020-01-14 DIAGNOSIS — C61 Malignant neoplasm of prostate: Secondary | ICD-10-CM

## 2020-01-15 LAB — PSA: Prostate Specific Ag, Serum: 0.5 ng/mL (ref 0.0–4.0)

## 2020-01-21 ENCOUNTER — Ambulatory Visit: Payer: Medicare Other | Admitting: Urology

## 2020-01-27 ENCOUNTER — Ambulatory Visit: Payer: Medicare Other | Admitting: Urology

## 2020-01-29 ENCOUNTER — Encounter: Payer: Self-pay | Admitting: Urology

## 2020-01-29 ENCOUNTER — Ambulatory Visit (INDEPENDENT_AMBULATORY_CARE_PROVIDER_SITE_OTHER): Payer: Medicare Other | Admitting: Urology

## 2020-01-29 ENCOUNTER — Other Ambulatory Visit: Payer: Self-pay

## 2020-01-29 VITALS — BP 152/72 | HR 52 | Ht 65.0 in | Wt 198.0 lb

## 2020-01-29 DIAGNOSIS — R399 Unspecified symptoms and signs involving the genitourinary system: Secondary | ICD-10-CM | POA: Diagnosis not present

## 2020-01-29 DIAGNOSIS — C61 Malignant neoplasm of prostate: Secondary | ICD-10-CM | POA: Diagnosis not present

## 2020-01-29 NOTE — Progress Notes (Signed)
01/29/2020 9:12 PM   Wesley Burton [birth year] [id number]  Referring provider: Leonel Ramsay, MD [place],  [place] [address]  Chief Complaint  Patient presents with  . Follow-up    Urologic history: 1. cT1c adenocarcinoma prostate             -Unfavorable intermediate risk             -Corrected PSA 10.64             -Fusion biopsy 04/2016 [facility]             -PI-RADS 4 lesion right mid prostate             -45 g prostate             -Gleason 4+3=7 involving 5/12 sampling cores  -Gleason 3+4 ROI biopsies             -Tx IMRT + ADT x6 months  -Radiation completed 11/16/2016   HPI: 86 y.o. male presents for annual follow-up.   No complaints today  He returned October 2020 last year complaining of storage related voiding symptoms  Given trial of Myrbetriq (samples) with improvement in symptoms  After completing samples voiding symptoms were minimal and not bothersome  Remains on finasteride and doxazosin  Last radiation oncology follow[facility] 2019  PSA 01/14/2020 stable at 0.5  Denies dysuria, gross hematuria  Denies flank, abdominal or pelvic pain    PMH: Past Medical History:  Diagnosis Date  . Arthritis   . Chest pain    a. 11/2015 admitted w/ cp in setting of malignant HTN, r/o. Inf wma on echo.  . CKD (chronic kidney disease), stage III   . Diastolic dysfunction    a. 11/2015 Echo: EF 50-55%, gr1 DD, inf HK, mild AI, nl RV.  Marland Kitchen Essential hypertension   . Gout   . Prostate disease   . Type II diabetes mellitus ([place])     Surgical History: Past Surgical History:  Procedure Laterality Date  . ABDOMINAL SURGERY    . SMALL INTESTINE SURGERY      Home Medications:  Allergies as of 01/29/2020   No Known Allergies     Medication List       Accurate as of January 29, 2020  9:12 PM. If you have any questions, ask your nurse or doctor.        allopurinol 300 MG tablet Commonly known as: ZYLOPRIM Take 300 mg by mouth  daily.   aspirin 81 MG EC tablet Take 1 tablet (81 mg total) by mouth daily.   atenolol 50 MG tablet Commonly known as: TENORMIN Take 0.5 tablets (25 mg total) by mouth daily.   donepezil 5 MG tablet Commonly known as: ARICEPT Take by mouth.   doxazosin 4 MG tablet Commonly known as: CARDURA TAKE 1 TABLET (4 MG TOTAL) BY MOUTH ONCE DAILY   finasteride 5 MG tablet Commonly known as: PROSCAR TAKE 1 TABLET BY MOUTH EVERY DAY   fluticasone 50 MCG/ACT nasal spray Commonly known as: FLONASE Place 2 sprays into the nose daily as needed for allergies.   hydrochlorothiazide 25 MG tablet Commonly known as: HYDRODIURIL Take 25 mg by mouth.   lisinopril 40 MG tablet Commonly known as: ZESTRIL Take 40 mg by mouth daily.   loratadine 10 MG tablet Commonly known as: CLARITIN Take by mouth.   memantine 5 MG tablet Commonly known as: NAMENDA Take 5 mg by mouth 2 (two) times daily.  Multi-Vitamins Tabs Take by mouth.   NIFEdipine 90 MG 24 hr tablet Commonly known as: PROCARDIA XL/NIFEDICAL-XL Take 90 mg by mouth daily.   traMADol 50 MG tablet Commonly known as: ULTRAM Take by mouth.       Allergies: No Known Allergies  Family History: Family History  Problem Relation Age of Onset  . Heart attack Mother   . CAD Mother   . Diabetes Father   . Heart attack Father   . CAD Father     Social History:  reports that he has never smoked. He has never used smokeless tobacco. He reports that he does not drink alcohol and does not use drugs.   Physical Exam: BP (!) 152/72   Pulse (!) 52   Ht 5\' 5"  (1.651 m)   Wt 198 lb (89.8 kg)   BMI 32.95 kg/m   Constitutional:  Alert and oriented, No acute distress. HEENT: [place] AT, moist mucus membranes.  Trachea midline, no masses. Cardiovascular: No clubbing, cyanosis, or edema. Respiratory: Normal respiratory effort, no increased work of breathing. Psychiatric: Normal mood and affect.   Assessment & Plan:    1.  T1c  intermediate risk prostate cancer  PSA remains low and stable  Continue annual follow-up  2.  Lower urinary tract symptoms  Stable on finasteride/doxazosin  These are refilled by his PCP   Abbie Sons, MD  [facility] [address] [place], [place] [address] [phone]

## 2020-11-02 ENCOUNTER — Other Ambulatory Visit: Payer: Self-pay | Admitting: *Deleted

## 2020-11-02 DIAGNOSIS — C61 Malignant neoplasm of prostate: Secondary | ICD-10-CM

## 2021-01-29 ENCOUNTER — Encounter: Payer: Self-pay | Admitting: Urology

## 2021-01-29 ENCOUNTER — Other Ambulatory Visit: Payer: Self-pay

## 2021-02-03 ENCOUNTER — Encounter: Payer: Self-pay | Admitting: Urology

## 2021-02-03 ENCOUNTER — Other Ambulatory Visit: Payer: Self-pay

## 2021-02-03 ENCOUNTER — Ambulatory Visit (INDEPENDENT_AMBULATORY_CARE_PROVIDER_SITE_OTHER): Payer: Medicare Other | Admitting: Urology

## 2021-02-03 VITALS — BP 138/76 | HR 56 | Ht 65.0 in | Wt 198.0 lb

## 2021-02-03 DIAGNOSIS — C61 Malignant neoplasm of prostate: Secondary | ICD-10-CM

## 2021-02-03 DIAGNOSIS — R399 Unspecified symptoms and signs involving the genitourinary system: Secondary | ICD-10-CM

## 2021-02-03 MED ORDER — FINASTERIDE 5 MG PO TABS: 5.0000 mg | ORAL_TABLET | Freq: Every day | ORAL | 11 refills | Status: DC

## 2021-02-03 NOTE — Progress Notes (Signed)
02/03/2021 2:16 PM   Ty Hilts [birth year] [id number]  Referring provider: Leonel Ramsay, MD [place],  [place] [address]  Chief Complaint  Patient presents with   Follow-up     Urologic history: 1. cT1c adenocarcinoma prostate             -Unfavorable intermediate risk             -Corrected PSA 10.64             -Fusion biopsy 04/2016 [facility]             -PI-RADS 4 lesion right mid prostate             -45 g prostate             -Gleason 4+3=7 involving 5/12 sampling cores             -Gleason 3+4 ROI biopsies             -Tx IMRT + ADT x6 months             -Radiation completed 11/16/2016  HPI: 87 y.o. male presents for annual follow-up.  Doing well since last visit No bothersome LUTS Denies dysuria, gross hematuria Denies flank, abdominal or pelvic pain Remains on finasteride  PMH: Past Medical History:  Diagnosis Date   Arthritis    Chest pain    a. 11/2015 admitted w/ cp in setting of malignant HTN, r/o. Inf wma on echo.   CKD (chronic kidney disease), stage III ([facility])    Diastolic dysfunction    a. 11/2015 Echo: EF 50-55%, gr1 DD, inf HK, mild AI, nl RV.   Essential hypertension    Gout    Prostate disease    Type II diabetes mellitus ([place])     Surgical History: Past Surgical History:  Procedure Laterality Date   ABDOMINAL SURGERY     SMALL INTESTINE SURGERY      Home Medications:  Allergies as of 02/03/2021   No Known Allergies      Medication List        Accurate as of February 03, 2021  2:16 PM. If you have any questions, ask your nurse or doctor.          allopurinol 300 MG tablet Commonly known as: ZYLOPRIM Take 300 mg by mouth daily.   aspirin 81 MG EC tablet Take 1 tablet (81 mg total) by mouth daily.   atenolol 50 MG tablet Commonly known as: TENORMIN Take 0.5 tablets (25 mg total) by mouth daily.   donepezil 5 MG tablet Commonly known as: ARICEPT Take by mouth.   doxazosin 4 MG  tablet Commonly known as: CARDURA TAKE 1 TABLET (4 MG TOTAL) BY MOUTH ONCE DAILY   finasteride 5 MG tablet Commonly known as: PROSCAR Take 1 tablet (5 mg total) by mouth daily.   fluticasone 50 MCG/ACT nasal spray Commonly known as: FLONASE Place 2 sprays into the nose daily as needed for allergies.   hydrochlorothiazide 25 MG tablet Commonly known as: HYDRODIURIL Take 25 mg by mouth.   lisinopril 40 MG tablet Commonly known as: ZESTRIL Take 40 mg by mouth daily.   loratadine 10 MG tablet Commonly known as: CLARITIN Take by mouth.   memantine 5 MG tablet Commonly known as: NAMENDA Take 5 mg by mouth 2 (two) times daily.   Multi-Vitamins Tabs Take by mouth.   NIFEdipine 90 MG 24 hr tablet Commonly known as: PROCARDIA XL/NIFEDICAL-XL Take 90  mg by mouth daily.   traMADol 50 MG tablet Commonly known as: ULTRAM Take by mouth.        Allergies: No Known Allergies  Family History: Family History  Problem Relation Age of Onset   Heart attack Mother    CAD Mother    Diabetes Father    Heart attack Father    CAD Father     Social History:  reports that he has never smoked. He has never used smokeless tobacco. He reports that he does not drink alcohol and does not use drugs.   Physical Exam: BP 138/76   Pulse (!) 56   Ht '5\' 5"'$  (1.651 m)   Wt 198 lb (89.8 kg)   BMI 32.95 kg/m   Constitutional:  Alert, No acute distress. HEENT: [place] AT, moist mucus membranes.  Trachea midline, no masses. Cardiovascular: No clubbing, cyanosis, or edema. Respiratory: Normal respiratory effort, no increased work of breathing. Skin: No rashes, bruises or suspicious lesions.   Assessment & Plan:    1.  T1c intermediate risk prostate cancer PSA drawn today and if stable continue annual follow-up  2.  Lower urinary tract symptoms Stable on finasteride Refill sent to [facility], [place] [facility] [address], [place], [place] [address] [phone]

## 2021-02-04 LAB — PSA: Prostate Specific Ag, Serum: 0.4 ng/mL (ref 0.0–4.0)

## 2021-02-05 ENCOUNTER — Telehealth: Payer: Self-pay | Admitting: *Deleted

## 2021-02-05 NOTE — Telephone Encounter (Signed)
Notified patient as instructed, patient pleased. Discussed follow-up appointments, patient agrees  

## 2021-02-05 NOTE — Telephone Encounter (Signed)
-----   Message from Abbie Sons, MD sent at 02/05/2021  9:08 AM EDT ----- PSA stable at 0.4.  Follow-up as scheduled

## 2021-02-08 ENCOUNTER — Encounter: Payer: Self-pay | Admitting: Urology

## 2022-01-19 ENCOUNTER — Other Ambulatory Visit: Payer: Self-pay | Admitting: Urology

## 2022-02-03 ENCOUNTER — Ambulatory Visit: Payer: Medicare Other | Admitting: Urology

## 2023-06-12 ENCOUNTER — Other Ambulatory Visit: Payer: Self-pay | Admitting: Urology

## 2023-07-17 ENCOUNTER — Other Ambulatory Visit: Payer: Self-pay | Admitting: Urology
